# Patient Record
Sex: Female | Born: 1990 | Race: Black or African American | Hispanic: No | Marital: Single | Smoking: Current every day smoker
Health system: Southern US, Community
[De-identification: ages and names within clinical notes are randomized; demographics above are authoritative.]

## PROBLEM LIST (undated history)

## (undated) DIAGNOSIS — F1910 Other psychoactive substance abuse, uncomplicated: Secondary | ICD-10-CM

## (undated) HISTORY — DX: Other psychoactive substance abuse, uncomplicated: F19.10

## (undated) HISTORY — PX: NO PAST SURGERIES: SHX2092

---
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
## 2019-07-07 ENCOUNTER — Encounter (HOSPITAL_COMMUNITY): Payer: Self-pay | Admitting: Emergency Medicine

## 2019-07-07 ENCOUNTER — Other Ambulatory Visit: Payer: Self-pay

## 2019-07-07 ENCOUNTER — Emergency Department (HOSPITAL_COMMUNITY)
Admission: EM | Admit: 2019-07-07 | Discharge: 2019-07-07 | Discharge status: Against Medical Advice | Payer: Self-pay | Attending: Emergency Medicine | Admitting: Emergency Medicine

## 2019-07-07 DIAGNOSIS — Z5321 Procedure and treatment not carried out due to patient leaving prior to being seen by health care provider: Secondary | ICD-10-CM | POA: Insufficient documentation

## 2019-07-07 NOTE — ED Notes (Signed)
Patient called for vitals, no response.

## 2019-07-07 NOTE — ED Triage Notes (Signed)
Pt relapsed on alcohol. Drinks a couple of pints of liquor daily. Last drink 1am. Pt alert, oriented.

## 2019-07-07 NOTE — ED Notes (Signed)
Patient stated "she was leaving to go to another hospital"

## 2019-07-07 NOTE — ED Notes (Signed)
Pt pacing around waiting room, very anxious.

## 2020-11-27 ENCOUNTER — Emergency Department (HOSPITAL_COMMUNITY): Payer: Self-pay

## 2020-11-27 ENCOUNTER — Encounter (HOSPITAL_COMMUNITY): Payer: Self-pay | Admitting: *Deleted

## 2020-11-27 ENCOUNTER — Other Ambulatory Visit: Payer: Self-pay

## 2020-11-27 ENCOUNTER — Emergency Department (HOSPITAL_COMMUNITY)
Admission: EM | Admit: 2020-11-27 | Discharge: 2020-11-27 | Disposition: A | Discharge status: Home / Self Care | Payer: Self-pay | Attending: Emergency Medicine | Admitting: Emergency Medicine

## 2020-11-27 DIAGNOSIS — Z23 Encounter for immunization: Secondary | ICD-10-CM | POA: Insufficient documentation

## 2020-11-27 DIAGNOSIS — Z20822 Contact with and (suspected) exposure to covid-19: Secondary | ICD-10-CM | POA: Insufficient documentation

## 2020-11-27 DIAGNOSIS — R410 Disorientation, unspecified: Secondary | ICD-10-CM | POA: Insufficient documentation

## 2020-11-27 DIAGNOSIS — F17200 Nicotine dependence, unspecified, uncomplicated: Secondary | ICD-10-CM | POA: Insufficient documentation

## 2020-11-27 DIAGNOSIS — R000 Tachycardia, unspecified: Secondary | ICD-10-CM | POA: Insufficient documentation

## 2020-11-27 DIAGNOSIS — S0081XA Abrasion of other part of head, initial encounter: Secondary | ICD-10-CM

## 2020-11-27 DIAGNOSIS — S62636A Displaced fracture of distal phalanx of right little finger, initial encounter for closed fracture: Secondary | ICD-10-CM

## 2020-11-27 DIAGNOSIS — F10121 Alcohol abuse with intoxication delirium: Secondary | ICD-10-CM | POA: Insufficient documentation

## 2020-11-27 DIAGNOSIS — R079 Chest pain, unspecified: Secondary | ICD-10-CM

## 2020-11-27 DIAGNOSIS — F10921 Alcohol use, unspecified with intoxication delirium: Secondary | ICD-10-CM

## 2020-11-27 DIAGNOSIS — Y92410 Unspecified street and highway as the place of occurrence of the external cause: Secondary | ICD-10-CM | POA: Insufficient documentation

## 2020-11-27 DIAGNOSIS — S0181XA Laceration without foreign body of other part of head, initial encounter: Secondary | ICD-10-CM | POA: Insufficient documentation

## 2020-11-27 LAB — PROTIME-INR
INR: 1.1 (ref 0.8–1.2)
Prothrombin Time: 13.5 seconds (ref 11.4–15.2)

## 2020-11-27 LAB — I-STAT CHEM 8, ED
BUN: 10 mg/dL (ref 6–20)
Calcium, Ion: 0.9 mmol/L — ABNORMAL LOW (ref 1.15–1.40)
Chloride: 107 mmol/L (ref 98–111)
Creatinine, Ser: 1.5 mg/dL — ABNORMAL HIGH (ref 0.44–1.00)
Glucose, Bld: 98 mg/dL (ref 70–99)
HCT: 38 % (ref 36.0–46.0)
Hemoglobin: 12.9 g/dL (ref 12.0–15.0)
Potassium: 3.9 mmol/L (ref 3.5–5.1)
Sodium: 140 mmol/L (ref 135–145)
TCO2: 20 mmol/L — ABNORMAL LOW (ref 22–32)

## 2020-11-27 LAB — RESP PANEL BY RT-PCR (FLU A&B, COVID) ARPGX2
Influenza A by PCR: NEGATIVE
Influenza B by PCR: NEGATIVE
SARS Coronavirus 2 by RT PCR: NEGATIVE

## 2020-11-27 LAB — SAMPLE TO BLOOD BANK

## 2020-11-27 LAB — COMPREHENSIVE METABOLIC PANEL
ALT: 19 U/L (ref 0–44)
AST: 44 U/L — ABNORMAL HIGH (ref 15–41)
Albumin: 4.1 g/dL (ref 3.5–5.0)
Alkaline Phosphatase: 33 U/L — ABNORMAL LOW (ref 38–126)
Anion gap: 11 (ref 5–15)
BUN: 9 mg/dL (ref 6–20)
CO2: 21 mmol/L — ABNORMAL LOW (ref 22–32)
Calcium: 8.7 mg/dL — ABNORMAL LOW (ref 8.9–10.3)
Chloride: 105 mmol/L (ref 98–111)
Creatinine, Ser: 0.87 mg/dL (ref 0.44–1.00)
GFR, Estimated: >60 mL/min (ref >60)
Glucose, Bld: 101 mg/dL — ABNORMAL HIGH (ref 70–99)
Potassium: 3.7 mmol/L (ref 3.5–5.1)
Sodium: 137 mmol/L (ref 135–145)
Total Bilirubin: 0.8 mg/dL (ref 0.3–1.2)
Total Protein: 7.6 g/dL (ref 6.5–8.1)

## 2020-11-27 LAB — CBC
HCT: 36.1 % (ref 36.0–46.0)
Hemoglobin: 11.7 g/dL — ABNORMAL LOW (ref 12.0–15.0)
MCH: 30.2 pg (ref 26.0–34.0)
MCHC: 32.4 g/dL (ref 30.0–36.0)
MCV: 93.3 fL (ref 80.0–100.0)
Platelets: 393 10*3/uL (ref 150–400)
RBC: 3.87 MIL/uL (ref 3.87–5.11)
RDW: 13.7 % (ref 11.5–15.5)
WBC: 6.8 10*3/uL (ref 4.0–10.5)
nRBC: 0 % (ref 0.0–0.2)

## 2020-11-27 LAB — I-STAT BETA HCG BLOOD, ED (MC, WL, AP ONLY): I-stat hCG, quantitative: <5 m[IU]/mL (ref <5)

## 2020-11-27 LAB — LACTIC ACID, PLASMA: Lactic Acid, Venous: 2.5 mmol/L (ref 0.5–1.9)

## 2020-11-27 LAB — ETHANOL: Alcohol, Ethyl (B): 430 mg/dL (ref <10)

## 2020-11-27 MED ORDER — KETAMINE HCL 50 MG/5ML IJ SOSY: 150.0000 mg | PREFILLED_SYRINGE | Freq: Once | INTRAMUSCULAR | Status: AC

## 2020-11-27 MED ORDER — FENTANYL CITRATE (PF) 100 MCG/2ML IJ SOLN
50.0000 ug | Freq: Once | INTRAMUSCULAR | Status: AC
Start: 2020-11-27 — End: 2020-11-27
  Administered 2020-11-27: 50 ug via INTRAVENOUS
  Filled 2020-11-27: qty 2

## 2020-11-27 MED ORDER — TETANUS-DIPHTH-ACELL PERTUSSIS 5-2.5-18.5 LF-MCG/0.5 IM SUSY
0.5000 mL | PREFILLED_SYRINGE | Freq: Once | INTRAMUSCULAR | Status: AC
  Administered 2020-11-27: 0.5 mL via INTRAMUSCULAR
  Filled 2020-11-27: qty 0.5

## 2020-11-27 MED ORDER — ONDANSETRON HCL 4 MG/2ML IJ SOLN
4.0000 mg | Freq: Once | INTRAMUSCULAR | Status: AC
  Administered 2020-11-27: 4 mg via INTRAVENOUS
  Filled 2020-11-27: qty 2

## 2020-11-27 MED ORDER — ONDANSETRON HCL 4 MG/2ML IJ SOLN
4.0000 mg | Freq: Once | INTRAMUSCULAR | Status: AC
  Administered 2020-11-27: 4 mg via INTRAVENOUS

## 2020-11-27 MED ORDER — KETAMINE HCL 50 MG/5ML IJ SOSY
PREFILLED_SYRINGE | INTRAMUSCULAR | Status: AC
  Administered 2020-11-27: 150 mg via INTRAVENOUS
  Filled 2020-11-27: qty 20

## 2020-11-27 MED ORDER — PROPOFOL 1000 MG/100ML IV EMUL
INTRAVENOUS | Status: AC
  Administered 2020-11-27: 20 mg
  Filled 2020-11-27: qty 100

## 2020-11-27 MED ORDER — FENTANYL CITRATE (PF) 100 MCG/2ML IJ SOLN
INTRAMUSCULAR | Status: AC
  Filled 2020-11-27: qty 2

## 2020-11-27 MED ORDER — IOHEXOL 300 MG/ML  SOLN
100.0000 mL | Freq: Once | INTRAMUSCULAR | Status: AC | PRN
  Administered 2020-11-27: 100 mL via INTRAVENOUS

## 2020-11-27 MED ORDER — SODIUM CHLORIDE 0.9 % IV BOLUS
1000.0000 mL | Freq: Once | INTRAVENOUS | Status: AC
  Administered 2020-11-27: 1000 mL via INTRAVENOUS

## 2020-11-27 MED ORDER — PROPOFOL 1000 MG/100ML IV EMUL: 5.0000 ug/kg/min | INTRAVENOUS | Status: DC

## 2020-11-27 MED ORDER — KETAMINE HCL 50 MG/5ML IJ SOSY
0.3000 mg/kg | PREFILLED_SYRINGE | Freq: Once | INTRAMUSCULAR | Status: DC
  Filled 2020-11-27: qty 5

## 2020-11-27 NOTE — ED Notes (Signed)
To c-t after portable chest in the dept

## 2020-11-27 NOTE — ED Notes (Signed)
The pt called someone on the phone   Dressed in scrubs and walked out the pt would not wait

## 2020-11-27 NOTE — ED Notes (Signed)
Order to lower propofol to extubate her  Down to 20 mg

## 2020-11-27 NOTE — ED Notes (Signed)
Ketamine 50 mg iv per karen cobb rn
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
## 2020-11-27 NOTE — ED Notes (Signed)
Eric-cousin, [phone], please call if pt gets d/c'd
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
## 2020-11-27 NOTE — ED Notes (Signed)
Propofol increased pts eyes open and shes starting to move around  10mg  bolus also

## 2020-11-27 NOTE — ED Notes (Signed)
Roc 100mg  given iv by karen cobb rn

## 2020-11-27 NOTE — ED Notes (Signed)
Dr Adela Lank waiting to get an official readying from the radiologist before he extubates the pt
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
## 2020-11-27 NOTE — ED Notes (Signed)
Trauma  Doctor   Freida Busman at bedside  Dr Adela Lank at bedside preparinf to intubate to get her treated

## 2020-11-27 NOTE — ED Notes (Signed)
Cynthia Payne [phone])

## 2020-11-27 NOTE — ED Notes (Signed)
Pt getting ketamine iv per karen cobb rn    100mg  ketamine     edp preparing to intubate  Po 99 %

## 2020-11-27 NOTE — ED Notes (Signed)
Pt fighting has to be held down  bolused with 20 mg per dr Adela Lank
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
## 2020-11-27 NOTE — ED Notes (Signed)
Pt returned from ct

## 2020-11-27 NOTE — ED Notes (Signed)
prppofol turned off per order dr Merwyn Katos.  At 1825.  Extubated by resp therapt 1835  Pt fighting vomited bloody secretions from the tube being pulled
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
## 2020-11-27 NOTE — ED Notes (Signed)
Ketamine 24mg   Iv by rn in c-t  Back to room unable to scan the pt fighting

## 2020-11-27 NOTE — ED Notes (Signed)
Intubated by dr Adela Lank
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
## 2020-11-27 NOTE — Procedures (Signed)
Extubation Procedure Note  Patient Details:   Name: Cynthia Payne DOB: 19-Feb-1991 MRN: [id number]   Airway Documentation:    Vent end date: (not recorded) Vent end time: (not recorded)   Evaluation  O2 sats: stable throughout Complications: No apparent complications Patient did tolerate procedure well. Bilateral Breath Sounds: Clear  Patient able to speak.  Yes  [name] 11/27/2020, 6:34 PM

## 2021-04-01 ENCOUNTER — Encounter (HOSPITAL_BASED_OUTPATIENT_CLINIC_OR_DEPARTMENT_OTHER): Payer: Self-pay | Admitting: Emergency Medicine

## 2021-04-01 ENCOUNTER — Other Ambulatory Visit: Payer: Self-pay

## 2021-04-01 ENCOUNTER — Emergency Department (HOSPITAL_BASED_OUTPATIENT_CLINIC_OR_DEPARTMENT_OTHER)
Admission: EM | Admit: 2021-04-01 | Discharge: 2021-04-01 | Disposition: A | Discharge status: Home / Self Care | Payer: Self-pay | Attending: Emergency Medicine | Admitting: Emergency Medicine

## 2021-04-01 DIAGNOSIS — N390 Urinary tract infection, site not specified: Secondary | ICD-10-CM | POA: Insufficient documentation

## 2021-04-01 DIAGNOSIS — F17210 Nicotine dependence, cigarettes, uncomplicated: Secondary | ICD-10-CM | POA: Insufficient documentation

## 2021-04-01 LAB — URINALYSIS, MICROSCOPIC (REFLEX): WBC, UA: >50 WBC/hpf (ref 0–5)

## 2021-04-01 LAB — URINALYSIS, ROUTINE W REFLEX MICROSCOPIC
Bilirubin Urine: NEGATIVE
Glucose, UA: NEGATIVE mg/dL
Ketones, ur: NEGATIVE mg/dL
Nitrite: NEGATIVE
Protein, ur: NEGATIVE mg/dL
Specific Gravity, Urine: >1.03 — ABNORMAL HIGH (ref 1.005–1.030)
pH: 5.5 (ref 5.0–8.0)

## 2021-04-01 LAB — PREGNANCY, URINE: Preg Test, Ur: NEGATIVE

## 2021-04-01 MED ORDER — CEPHALEXIN 500 MG PO CAPS: 500.0000 mg | ORAL_CAPSULE | Freq: Two times a day (BID) | ORAL | 0 refills | Status: DC

## 2021-04-01 NOTE — ED Provider Notes (Signed)
[facility] EMERGENCY DEPARTMENT Provider Note   CSN: [id number] Arrival date & time: 04/01/21  7026     History Chief Complaint  Patient presents with   Urinary Frequency    Cynthia Payne is a 30 y.o. female.  Patient is a 30 year old female who presents from [facility] treatment [facility].  She reports a 2-day history of some pressure when she urinates.  She says when she has a sense of urination she gets a pressure in her bladder area.  She currently denies any pain in her abdomen.  No back pain.  She has some urinary frequency but no burning on urination.  No fevers.  No nausea or vomiting.  She started her menstrual cycle today.  She has had a history of prior UTIs in the past.  She denies any vaginal bleeding or discharge.  She also is concerned because she has not been able to sleep very well at [facility].  She has tried melatonin that has not helped.      History reviewed. No pertinent past medical history.  There are no problems to display for this patient.   History reviewed. No pertinent surgical history.   OB History   No obstetric history on file.     No family history on file.  Social History   Tobacco Use   Smoking status: Every Day    Pack years: 0.00    Types: Cigarettes   Smokeless tobacco: Never  Substance Use Topics   Alcohol use: Yes    Comment: couple pints of liquor daily   Drug use: Never    Home Medications Prior to Admission medications   Medication Sig Start Date End Date Taking? Authorizing Provider  cephALEXin (KEFLEX) 500 MG capsule Take 1 capsule (500 mg total) by mouth 2 (two) times daily. 04/01/21  Yes Rolan Bucco, MD    Allergies    Other  Review of Systems   Review of Systems  Constitutional:  Negative for chills, diaphoresis, fatigue and fever.  HENT:  Negative for congestion, rhinorrhea and sneezing.   Eyes: Negative.   Respiratory:  Negative for cough, chest tightness and shortness of breath.   Cardiovascular:   Negative for chest pain and leg swelling.  Gastrointestinal:  Negative for abdominal pain, blood in stool, diarrhea, nausea and vomiting.  Genitourinary:  Positive for frequency and vaginal bleeding. Negative for difficulty urinating, flank pain, hematuria, vaginal discharge and vaginal pain.  Musculoskeletal:  Negative for arthralgias and back pain.  Skin:  Negative for rash.  Neurological:  Negative for dizziness, speech difficulty, weakness, numbness and headaches.   Physical Exam Updated Vital Signs BP 115/84 (BP Location: Right Arm)   Pulse 77   Temp 98.1 F (36.7 C) (Oral)   Resp 17   Ht 5\' 8"  (1.727 m)   Wt 77.1 kg   SpO2 99%   BMI 25.85 kg/m   Physical Exam Constitutional:      Appearance: She is well-developed.  HENT:     Head: Normocephalic and atraumatic.  Eyes:     Pupils: Pupils are equal, round, and reactive to light.  Cardiovascular:     Rate and Rhythm: Normal rate and regular rhythm.     Heart sounds: Normal heart sounds.  Pulmonary:     Effort: Pulmonary effort is normal. No respiratory distress.     Breath sounds: Normal breath sounds. No wheezing or rales.  Chest:     Chest wall: No tenderness.  Abdominal:     General: Bowel  sounds are normal.     Palpations: Abdomen is soft.     Tenderness: There is no abdominal tenderness. There is no guarding or rebound.  Musculoskeletal:        General: Normal range of motion.     Cervical back: Normal range of motion and neck supple.  Lymphadenopathy:     Cervical: No cervical adenopathy.  Skin:    General: Skin is warm and dry.     Findings: No rash.  Neurological:     Mental Status: She is alert and oriented to person, place, and time.    ED Results / Procedures / Treatments   Labs (all labs ordered are listed, but only abnormal results are displayed) Labs Reviewed  URINALYSIS, ROUTINE W REFLEX MICROSCOPIC - Abnormal; Notable for the following components:      Result Value   APPearance CLOUDY (*)     Specific Gravity, Urine >1.030 (*)    Hgb urine dipstick LARGE (*)    Leukocytes,Ua LARGE (*)    All other components within normal limits  URINALYSIS, MICROSCOPIC (REFLEX) - Abnormal; Notable for the following components:   Bacteria, UA MANY (*)    All other components within normal limits  URINE CULTURE  PREGNANCY, URINE    EKG None  Radiology No results found.  Procedures Procedures   Medications Ordered in ED Medications - No data to display  ED Course  I have reviewed the triage vital signs and the nursing notes.  Pertinent labs & imaging results that were available during my care of the patient were reviewed by me and considered in my medical decision making (see chart for details).    MDM Rules/Calculators/A&P                          Patient presents with some suprapubic pressure on urination.  Her urine is consistent with infection.  She does not have other symptoms that would be more concerning for renal colic.  She does not appear systemically ill.  She is having no vaginal symptoms.  She was started on Keflex.  Her urine was sent for culture.  She was advised that she could use Benadryl at night for sleeping.  Return precautions were given. Final Clinical Impression(s) / ED Diagnoses Final diagnoses:  Lower urinary tract infectious disease    Rx / DC Orders ED Discharge Orders          Ordered    cephALEXin (KEFLEX) 500 MG capsule  2 times daily        04/01/21 0854             Rolan Bucco, MD 04/01/21 [phone]

## 2021-04-01 NOTE — ED Triage Notes (Signed)
Pt reports urinary frequency and left sided groin "pressure."

## 2021-04-03 LAB — URINE CULTURE: Culture: >=100000 — AB

## 2021-04-04 ENCOUNTER — Telehealth: Payer: Self-pay | Admitting: *Deleted

## 2021-04-04 NOTE — Telephone Encounter (Signed)
Post ED Visit - Positive Culture Follow-up  Culture report reviewed by antimicrobial stewardship pharmacist: [facility] []  Nathan Batchelder, Pharm.D. []  , Pharm.D., BCPS AQ-ID []  , Pharm.D., BCPS []  Celedonio Miyamoto, .D., BCPS []  [place], .D., BCPS, AAHIVP []  Georgina Pillion, Pharm.D., BCPS, AAHIVP []  [address], PharmD, BCPS []  , PharmD, BCPS []  [place], PharmD, BCPS []  [address], PharmD []  , PharmD, BCPS []  Estella Husk, PharmD  Pharmacy Team []  Lysle Pearl, PharmD []  , PharmD []  Phillips Climes, PharmD []  , Rph []  Agapito Games) , PharmD []  Verlan Friends, PharmD []  , PharmD []  Mervyn Gay, PharmD []  , PharmD []  Vinnie Level, PharmD []  [facility], PharmD []  , PharmD []  Len Childs, PharmD   Positive urine culture Treated with Cephalexin, organism sensitive to the same and no further patient follow-up is required at this time. Khushboo patel, PharmD [place] 04/04/2021, 12:03 PM

## 2021-06-07 ENCOUNTER — Ambulatory Visit: Payer: Self-pay

## 2021-06-21 ENCOUNTER — Ambulatory Visit: Payer: Self-pay | Admitting: Gerontology

## 2021-06-21 ENCOUNTER — Other Ambulatory Visit: Payer: Self-pay

## 2021-06-21 ENCOUNTER — Encounter: Payer: Self-pay | Admitting: Gerontology

## 2021-06-21 VITALS — BP 118/81 | HR 83 | Temp 98.1°F | Resp 16 | Ht 67.75 in | Wt 183.5 lb

## 2021-06-21 DIAGNOSIS — N946 Dysmenorrhea, unspecified: Secondary | ICD-10-CM

## 2021-06-21 DIAGNOSIS — K0889 Other specified disorders of teeth and supporting structures: Secondary | ICD-10-CM

## 2021-06-21 DIAGNOSIS — Z0000 Encounter for general adult medical examination without abnormal findings: Secondary | ICD-10-CM

## 2021-06-21 DIAGNOSIS — F1990 Other psychoactive substance use, unspecified, uncomplicated: Secondary | ICD-10-CM

## 2021-06-21 MED ORDER — VITAMIN B-12 1000 MCG PO TABS
1000.0000 ug | ORAL_TABLET | Freq: Every day | ORAL | 1 refills | Status: DC
  Filled 2021-06-21: qty 90, 90d supply, fill #0

## 2021-06-21 MED ORDER — VITAMIN B-1 100 MG PO TABS
100.0000 mg | ORAL_TABLET | Freq: Every day | ORAL | 1 refills | Status: DC
  Filled 2021-06-21: qty 30, 30d supply, fill #0

## 2021-06-21 MED ORDER — IBUPROFEN 800 MG PO TABS
800.0000 mg | ORAL_TABLET | Freq: Three times a day (TID) | ORAL | 1 refills | Status: DC | PRN
  Filled 2021-06-21: qty 60, 20d supply, fill #0

## 2021-06-21 MED ORDER — FOLIC ACID 1 MG PO TABS
1.0000 mg | ORAL_TABLET | Freq: Every day | ORAL | 1 refills | Status: DC
  Filled 2021-06-21: qty 30, 30d supply, fill #0

## 2021-06-21 NOTE — Progress Notes (Signed)
 Patient ID: Cynthia Payne, female   DOB: 13-Sep-1991, 30 y.o.   MRN: [id number]  Chief Complaint  Patient presents with   Establish Care   elevated lfts    Patient has a history of alcohol abuse. Patient has been 100 days sober (03/13/21). Patient is a resident of [facility].    HPI Cynthia Payne is a 30 y.o. female is a 30 y/o female with a h/o substance use disorder who presents for an initial office visit. She reports multiple health concerns today. Back pain: which she describes as chronic. Pain is mostly in the lower back.  Previous management with nsaids and tylenol has not been successful. Unable to identify aggravating or alleviating factors. No associated numbness and tingling Toothache: reports multiple teeth with caries. Reports tooth pain as severe. She has not seen a dentist in years. Denies bleeding Painful menses: chronic; and heavy periods. Has not taken any remedies ETOH abuse: reports current remission and has been adhering to the treatment plan ar [facility]. She is unsure about her vaccines. She has not had a recent pap smear.    Past Medical History:  Diagnosis Date   Substance abuse [facility])     Past Surgical History:  Procedure Laterality Date   NO PAST SURGERIES      Family History  Problem Relation Age of Onset   Healthy Mother    Congestive Heart Failure Father    Alcoholism Father    Drug abuse Brother        heroin overdose    Social History Social History   Tobacco Use   Smoking status: Every Day    Packs/day: 0.50    Years: 13.00    Pack years: 6.50    Types: Cigarettes   Smokeless tobacco: Never   Tobacco comments:    Patient is a resident at [facility] receiving treatment for alcoholism  Vaping Use   Vaping Use: Never used  Substance Use Topics   Alcohol use: Not Currently    Comment: couple pints of liquor daily, last use 03/13/21   Drug use: Never    Allergies  Allergen Reactions   Other Itching and Other (See Comments)    Seasonal allergies-  Runny nose, itchy eyes, sniffles    Current Outpatient Medications  Medication Sig Dispense Refill   triamcinolone  ointment (KENALOG ) 0.1 % Apply once or twice daily to affected areas for 1 to 2 weeks     No current facility-administered medications for this visit.    Review of Systems Review of Systems  Constitutional:  Negative for appetite change, chills, fatigue and fever.  Eyes: Negative.   Endocrine: Negative for polydipsia, polyphagia and polyuria.  Musculoskeletal:  Positive for back pain.  Skin: Negative.   Allergic/Immunologic: Negative for environmental allergies.  Neurological: Negative.   Hematological: Negative.   Psychiatric/Behavioral:  Negative for sleep disturbance and suicidal ideas.     Blood pressure 118/81, pulse 83, temperature 98.1 F (36.7 C), resp. rate 16, height 5' 7.75 (1.721 m), weight 183 lb 8 oz (83.2 kg), last menstrual period 06/18/2021, SpO2 98 %.  Physical Exam Physical Exam Vitals and nursing note reviewed.  Constitutional:      Appearance: Normal appearance.  HENT:     Nose: Nose normal.     Mouth/Throat:     Mouth: Mucous membranes are moist.     Comments: Poor dentition with caries in multiple teeth, mild gum inflammation Eyes:     Extraocular Movements: Extraocular movements intact.     Conjunctiva/sclera:  Conjunctivae normal.     Pupils: Pupils are equal, round, and reactive to light.  Cardiovascular:     Rate and Rhythm: Normal rate and regular rhythm.     Pulses: Normal pulses.     Heart sounds: Normal heart sounds.  Pulmonary:     Effort: Pulmonary effort is normal.     Breath sounds: Normal breath sounds.  Abdominal:     General: Abdomen is flat. Bowel sounds are normal.     Palpations: Abdomen is soft.  Musculoskeletal:        General: No tenderness. Normal range of motion.     Cervical back: Normal range of motion and neck supple.  Skin:    General: Skin is warm and dry.     Capillary Refill: Capillary refill  takes less than 2 seconds.  Neurological:     General: No focal deficit present.     Mental Status: She is alert and oriented to person, place, and time.  Psychiatric:        Mood and Affect: Mood normal.        Thought Content: Thought content normal.     Assessment and plan 1. Dysmenorrhea (Primary) Referral to OB Advised to take OTC iron supplements  2. Substance use disorder Continue treatment at [facility]  3. Toothache Will give augmenting while awaiting denistal eval Referral to dentist provided with coupon  4. Health maintenance examination Will order routine labs. Health maintenance education provided - Ambulatory referral to Psychiatry - Cytology - PAP( [place]) - Ambulatory referral to Dentistry - CBC w/Diff - Comp Met ([facility]) - TSH - HgB A1c - Lipid Profile - Magnesium - Phosphorus      Cynthia Payne 06/21/2021, 1:22 PM

## 2021-06-21 NOTE — Patient Instructions (Addendum)

## 2021-06-22 LAB — LIPID PANEL
Chol/HDL Ratio: 2.8 ratio (ref 0.0–4.4)
Cholesterol, Total: 184 mg/dL (ref 100–199)
HDL: 65 mg/dL (ref >39)
LDL Chol Calc (NIH): 108 mg/dL — ABNORMAL HIGH (ref 0–99)
Triglycerides: 57 mg/dL (ref 0–149)
VLDL Cholesterol Cal: 11 mg/dL (ref 5–40)

## 2021-06-22 LAB — HEMOGLOBIN A1C
Est. average glucose Bld gHb Est-mCnc: 103 mg/dL
Hgb A1c MFr Bld: 5.2 % (ref 4.8–5.6)

## 2021-06-22 LAB — TSH: TSH: 0.784 u[IU]/mL (ref 0.450–4.500)

## 2021-06-22 LAB — COMPREHENSIVE METABOLIC PANEL
ALT: 8 IU/L (ref 0–32)
AST: 16 IU/L (ref 0–40)
Albumin/Globulin Ratio: 1.7 (ref 1.2–2.2)
Albumin: 4.3 g/dL (ref 3.8–4.8)
Alkaline Phosphatase: 46 IU/L (ref 44–121)
BUN/Creatinine Ratio: 13 (ref 9–23)
BUN: 10 mg/dL (ref 6–20)
Bilirubin Total: <0.2 mg/dL (ref 0.0–1.2)
CO2: 20 mmol/L (ref 20–29)
Calcium: 9.2 mg/dL (ref 8.7–10.2)
Chloride: 105 mmol/L (ref 96–106)
Creatinine, Ser: 0.78 mg/dL (ref 0.57–1.00)
Globulin, Total: 2.6 g/dL (ref 1.5–4.5)
Glucose: 89 mg/dL (ref 70–99)
Potassium: 3.9 mmol/L (ref 3.5–5.2)
Sodium: 139 mmol/L (ref 134–144)
Total Protein: 6.9 g/dL (ref 6.0–8.5)
eGFR: 104 mL/min/{1.73_m2} (ref >59)

## 2021-06-22 LAB — CBC WITH DIFFERENTIAL/PLATELET
Basophils Absolute: 0.1 10*3/uL (ref 0.0–0.2)
Basos: 2 %
EOS (ABSOLUTE): 0.1 10*3/uL (ref 0.0–0.4)
Eos: 2 %
Hematocrit: 31.2 % — ABNORMAL LOW (ref 34.0–46.6)
Hemoglobin: 10.5 g/dL — ABNORMAL LOW (ref 11.1–15.9)
Immature Grans (Abs): 0 10*3/uL (ref 0.0–0.1)
Immature Granulocytes: 0 %
Lymphocytes Absolute: 1.4 10*3/uL (ref 0.7–3.1)
Lymphs: 34 %
MCH: 29.9 pg (ref 26.6–33.0)
MCHC: 33.7 g/dL (ref 31.5–35.7)
MCV: 89 fL (ref 79–97)
Monocytes Absolute: 0.5 10*3/uL (ref 0.1–0.9)
Monocytes: 13 %
Neutrophils Absolute: 2 10*3/uL (ref 1.4–7.0)
Neutrophils: 49 %
Platelets: 365 10*3/uL (ref 150–450)
RBC: 3.51 x10E6/uL — ABNORMAL LOW (ref 3.77–5.28)
RDW: 12.2 % (ref 11.7–15.4)
WBC: 4.1 10*3/uL (ref 3.4–10.8)

## 2021-06-22 LAB — MAGNESIUM: Magnesium: 1.8 mg/dL (ref 1.6–2.3)

## 2021-06-22 LAB — PHOSPHORUS: Phosphorus: 3.7 mg/dL (ref 3.0–4.3)

## 2021-06-26 ENCOUNTER — Other Ambulatory Visit: Payer: Self-pay

## 2021-06-26 ENCOUNTER — Ambulatory Visit: Payer: Self-pay | Admitting: Pharmacy Technician

## 2021-06-26 DIAGNOSIS — Z79899 Other long term (current) drug therapy: Secondary | ICD-10-CM

## 2021-06-29 ENCOUNTER — Other Ambulatory Visit: Payer: Self-pay

## 2021-06-29 NOTE — Progress Notes (Signed)
Provided patient with contact information for [facility].  Completed [facility] application and contract.  Patient agreed to all terms of the [facility] contract.  Sending to patient to sign and return to [facility].  Patient approved to receive medication assistance at [facility] until time for re-certification in 5501, and as long as eligibility criteria continues to be met.    Provided patient with community resource material based on her particular needs.    [facility]

## 2021-07-06 ENCOUNTER — Other Ambulatory Visit: Payer: Self-pay

## 2021-07-19 ENCOUNTER — Other Ambulatory Visit: Payer: Self-pay

## 2021-07-19 ENCOUNTER — Encounter: Payer: Self-pay | Admitting: Gerontology

## 2021-07-19 ENCOUNTER — Ambulatory Visit: Payer: Self-pay | Admitting: Gerontology

## 2021-07-19 VITALS — BP 114/77 | HR 62 | Temp 98.1°F | Resp 16 | Ht 67.0 in | Wt 188.6 lb

## 2021-07-19 DIAGNOSIS — Z0000 Encounter for general adult medical examination without abnormal findings: Secondary | ICD-10-CM

## 2021-07-19 DIAGNOSIS — L853 Xerosis cutis: Secondary | ICD-10-CM | POA: Insufficient documentation

## 2021-07-19 DIAGNOSIS — K0889 Other specified disorders of teeth and supporting structures: Secondary | ICD-10-CM | POA: Insufficient documentation

## 2021-07-19 MED ORDER — TRIAMCINOLONE ACETONIDE 0.1 % EX OINT
TOPICAL_OINTMENT | CUTANEOUS | 0 refills | Status: DC
  Filled 2021-07-19 – 2021-07-20 (×2): qty 30, 14d supply, fill #0

## 2021-07-19 NOTE — Progress Notes (Signed)
Established Patient Office Visit  Subjective:  Patient ID: Cynthia Payne, female    DOB: Oct 18, 1990  Age: 30 y.o. MRN: [id number]  CC:  Chief Complaint  Patient presents with   Follow-up    Labs drawn 06/21/21   Medication Refill    Patient requesting refill on Triamcinolone cream    HPI Cynthia Payne  is a 29 y/o female who has history of substance abuse,presents for lab review. Her lab done on 06/21/21, LDL was 108 mg/dl, Hgb 10.5 and Hct was 31.2%. She states that she experiences dry pruritic area to her face and using Triamcinolone cream improves symptoms. She also c/o pain to her wisdom teeth, denies swelling,and erythema. She states that her mood is good, denies suicidal nor homicidal ideation. Overall, she states that she's doing well and offers no further complaint.  Past Medical History:  Diagnosis Date   Substance abuse [facility])     Past Surgical History:  Procedure Laterality Date   NO PAST SURGERIES      Family History  Problem Relation Age of Onset   Healthy Mother    Congestive Heart Failure Father    Alcoholism Father    Drug abuse Brother        heroin overdose    Social History   Socioeconomic History   Marital status: Single    Spouse name: Not on file   Number of children: Not on file   Years of education: Not on file   Highest education level: Not on file  Occupational History   Not on file  Tobacco Use   Smoking status: Every Day    Packs/day: 0.50    Years: 13.00    Pack years: 6.50    Types: Cigarettes   Smokeless tobacco: Never   Tobacco comments:    Patient is a resident at [facility] receiving treatment for alcoholism  Vaping Use   Vaping Use: Never used  Substance and Sexual Activity   Alcohol use: Not Currently    Comment: couple pints of liquor daily, last use 03/13/21   Drug use: Never   Sexual activity: Not Currently    Birth control/protection: None  Other Topics Concern   Not on file  Social History Narrative   ** Merged  History Encounter **       Social Determinants of Health   Financial Resource Strain: Not on file  Food Insecurity: No Food Insecurity   Worried About Charity fundraiser in the Last Year: Never true   [place] in the Last Year: Never true  Transportation Needs: No Transportation Needs   Lack of Transportation (Medical): No   Lack of Transportation (Non-Medical): No  Physical Activity: Not on file  Stress: Not on file  Social Connections: Not on file  Intimate Partner Violence: Not on file    Outpatient Medications Prior to Visit  Medication Sig Dispense Refill   folic acid (FOLVITE) 1 MG tablet Take 1 tablet (1 mg total) by mouth once daily. 90 tablet 1   ibuprofen (ADVIL) 800 MG tablet Take 1 tablet (800 mg total) by mouth every 8 (eight) hours as needed. 60 tablet 1   thiamine (VITAMIN B-1) 100 MG tablet Take 1 tablet (100 mg total) by mouth once daily. 90 tablet 1   vitamin B-12 (CYANOCOBALAMIN) 1000 MCG tablet Take 1 tablet (1,000 mcg total) by mouth once daily. 90 tablet 1   triamcinolone ointment (KENALOG) 0.1 % Apply once or twice daily to affected areas  for 1 to 2 weeks (Patient not taking: Reported on 07/19/2021)     No facility-administered medications prior to visit.    Allergies  Allergen Reactions   No Known Allergies    Other Itching and Other (See Comments)    Seasonal allergies- Runny nose, itchy eyes, sniffles    ROS Review of Systems  Constitutional: Negative.   Eyes: Negative.   Respiratory: Negative.    Cardiovascular: Negative.   Neurological: Negative.   Psychiatric/Behavioral: Negative.       Objective:    Physical Exam HENT:     Head: Normocephalic and atraumatic.  Cardiovascular:     Rate and Rhythm: Normal rate and regular rhythm.     Pulses: Normal pulses.     Heart sounds: Normal heart sounds.  Pulmonary:     Effort: Pulmonary effort is normal.     Breath sounds: Normal breath sounds.  Skin:    General: Skin is warm.      Findings: No lesion.  Neurological:     General: No focal deficit present.     Mental Status: She is alert and oriented to person, place, and time. Mental status is at baseline.  Psychiatric:        Mood and Affect: Mood normal.        Behavior: Behavior normal.        Thought Content: Thought content normal.        Judgment: Judgment normal.    BP 114/77 (BP Location: Left Arm, Patient Position: Sitting, Cuff Size: Large)   Pulse 62   Temp 98.1 F (36.7 C) (Oral)   Resp 16   Ht '5\' 7"'  (1.702 m)   Wt 188 lb 9.6 oz (85.5 kg)   LMP 07/11/2021 (Exact Date)   SpO2 98%   BMI 29.54 kg/m  Wt Readings from Last 3 Encounters:  07/19/21 188 lb 9.6 oz (85.5 kg)  06/21/21 183 lb 8 oz (83.2 kg)  04/01/21 170 lb (77.1 kg)     Health Maintenance Due  Topic Date Due   COVID-19 Vaccine (1) Never done   Pneumococcal Vaccine 79-38 Years old (1 - PCV) Never done   HIV Screening  Never done   Hepatitis C Screening  Never done   PAP SMEAR-Modifier  Never done    There are no preventive care reminders to display for this patient.  Lab Results  Component Value Date   TSH 0.784 06/21/2021   Lab Results  Component Value Date   WBC 4.1 06/21/2021   HGB 10.5 (L) 06/21/2021   HCT 31.2 (L) 06/21/2021   MCV 89 06/21/2021   PLT 365 06/21/2021   Lab Results  Component Value Date   NA 139 06/21/2021   K 3.9 06/21/2021   CO2 20 06/21/2021   GLUCOSE 89 06/21/2021   BUN 10 06/21/2021   CREATININE 0.78 06/21/2021   BILITOT <0.2 06/21/2021   ALKPHOS 46 06/21/2021   AST 16 06/21/2021   ALT 8 06/21/2021   PROT 6.9 06/21/2021   ALBUMIN 4.3 06/21/2021   CALCIUM 9.2 06/21/2021   ANIONGAP 11 11/27/2020   EGFR 104 06/21/2021   Lab Results  Component Value Date   CHOL 184 06/21/2021   Lab Results  Component Value Date   HDL 65 06/21/2021   Lab Results  Component Value Date   LDLCALC 108 (H) 06/21/2021   Lab Results  Component Value Date   TRIG 57 06/21/2021   Lab Results   Component Value Date  CHOLHDL 2.8 06/21/2021   Lab Results  Component Value Date   HGBA1C 5.2 06/21/2021      Assessment & Plan:    1. Dry skin dermatitis -She will apply cream, educated on medication side effects and advised to notify clinic. - triamcinolone ointment (KENALOG) 0.1 %; Apply once or twice daily to affected areas for 1 to 2 weeks  Dispense: 30 g; Refill: 0  2. Health maintenance examination - She was provided with Health Department information to schedule appointment for Pap smear.  3. Tooth ache -She was provided with Dental application and referral to Dentist. She was advised to notify clinic for worsening symptoms.     Follow-up: Return in about 6 months (around 01/17/2022), or if symptoms worsen or fail to improve.    Geniva Lohnes Jerold Coombe, NP

## 2021-07-20 ENCOUNTER — Other Ambulatory Visit: Payer: Self-pay

## 2021-07-24 ENCOUNTER — Other Ambulatory Visit: Payer: Self-pay
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
## 2021-10-18 ENCOUNTER — Ambulatory Visit: Payer: Self-pay | Admitting: Gerontology

## 2021-10-26 ENCOUNTER — Ambulatory Visit: Payer: Self-pay | Admitting: Gerontology

## 2021-10-26 ENCOUNTER — Other Ambulatory Visit: Payer: Self-pay

## 2021-10-26 VITALS — BP 119/79 | HR 65 | Temp 98.9°F | Resp 16 | Wt 196.9 lb

## 2021-10-26 DIAGNOSIS — M25571 Pain in right ankle and joints of right foot: Secondary | ICD-10-CM | POA: Insufficient documentation

## 2021-10-26 DIAGNOSIS — L853 Xerosis cutis: Secondary | ICD-10-CM

## 2021-10-26 NOTE — Progress Notes (Signed)
Established Patient Office Visit  Subjective:  Patient ID: Cynthia Payne, female    DOB: 01/12/1991  Age: 31 y.o. MRN: [id number]  CC: No chief complaint on file.   HPI Cynthia Payne  is a 31 y/o female who has history of substance use disorder,presents for follow up appointment. She reports experiencing intermittent pruritic  scattered rash that leaves dark spots to her face when it resolves. She states that applying triamcinolone cream offers relief when rash erupts, but requests Dermatology referral. She also c/o intermittent  non traumatic sharp 8/10 pain to her right ankle that started  3 days ago. Currently, she states that pain has resolved. She resides at [facility] for rehabilitation. Overall, she states that she's doing well and offers no further complaint.  Past Medical History:  Diagnosis Date   Substance abuse [facility])     Past Surgical History:  Procedure Laterality Date   NO PAST SURGERIES      Family History  Problem Relation Age of Onset   Healthy Mother    Congestive Heart Failure Father    Alcoholism Father    Drug abuse Brother        heroin overdose    Social History   Socioeconomic History   Marital status: Single    Spouse name: Not on file   Number of children: Not on file   Years of education: Not on file   Highest education level: Not on file  Occupational History   Not on file  Tobacco Use   Smoking status: Every Day    Packs/day: 0.50    Years: 13.00    Pack years: 6.50    Types: Cigarettes   Smokeless tobacco: Never   Tobacco comments:    Patient is a resident at [facility] receiving treatment for alcoholism  Vaping Use   Vaping Use: Never used  Substance and Sexual Activity   Alcohol use: Not Currently    Comment: couple pints of liquor daily, last use 03/13/21   Drug use: Never   Sexual activity: Not Currently    Birth control/protection: None  Other Topics Concern   Not on file  Social History Narrative   ** Merged History Encounter **        Social Determinants of Health   Financial Resource Strain: Not on file  Food Insecurity: No Food Insecurity   Worried About Charity fundraiser in the Last Year: Never true   [place] in the Last Year: Never true  Transportation Needs: No Transportation Needs   Lack of Transportation (Medical): No   Lack of Transportation (Non-Medical): No  Physical Activity: Not on file  Stress: Not on file  Social Connections: Not on file  Intimate Partner Violence: Not on file    Outpatient Medications Prior to Visit  Medication Sig Dispense Refill   folic acid (FOLVITE) 1 MG tablet Take 1 tablet (1 mg total) by mouth once daily. 90 tablet 1   ibuprofen (ADVIL) 800 MG tablet Take 1 tablet (800 mg total) by mouth every 8 (eight) hours as needed. 60 tablet 1   thiamine (VITAMIN B-1) 100 MG tablet Take 1 tablet (100 mg total) by mouth once daily. 90 tablet 1   triamcinolone ointment (KENALOG) 0.1 % Apply topically once or twice daily to affected area(s) for 1 to 2 weeks. 30 g 0   vitamin B-12 (CYANOCOBALAMIN) 1000 MCG tablet Take 1 tablet (1,000 mcg total) by mouth once daily. 90 tablet 1   No  facility-administered medications prior to visit.    Allergies  Allergen Reactions   No Known Allergies    Other Itching and Other (See Comments)    Seasonal allergies- Runny nose, itchy eyes, sniffles    ROS Review of Systems  Constitutional: Negative.   Respiratory: Negative.    Cardiovascular: Negative.   Musculoskeletal:  Positive for arthralgias (right ankle pain).  Skin:        Dark spots to face  Neurological: Negative.   Psychiatric/Behavioral: Negative.       Objective:    Physical Exam HENT:     Head: Normocephalic and atraumatic.  Cardiovascular:     Rate and Rhythm: Normal rate and regular rhythm.     Pulses: Normal pulses.     Heart sounds: Normal heart sounds.  Pulmonary:     Effort: Pulmonary effort is normal.     Breath sounds: Normal breath sounds.   Musculoskeletal:        General: Normal range of motion.  Skin:    Comments: Scattered dark discoloration to face  Neurological:     General: No focal deficit present.     Mental Status: She is alert and oriented to person, place, and time. Mental status is at baseline.  Psychiatric:        Mood and Affect: Mood normal.        Behavior: Behavior normal.        Thought Content: Thought content normal.        Judgment: Judgment normal.    BP 119/79 (BP Location: Left Arm, Patient Position: Sitting, Cuff Size: Large)    Pulse 65    Temp 98.9 F (37.2 C)    Resp 16    Wt 196 lb 14.4 oz (89.3 kg)    SpO2 98%    BMI 30.84 kg/m  Wt Readings from Last 3 Encounters:  10/26/21 196 lb 14.4 oz (89.3 kg)  07/19/21 188 lb 9.6 oz (85.5 kg)  06/21/21 183 lb 8 oz (83.2 kg)   Encouraged weight loss  Health Maintenance Due  Topic Date Due   COVID-19 Vaccine (1) Never done   HIV Screening  Never done   Hepatitis C Screening  Never done   PAP SMEAR-Modifier  Never done    There are no preventive care reminders to display for this patient.  Lab Results  Component Value Date   TSH 0.784 06/21/2021   Lab Results  Component Value Date   WBC 4.1 06/21/2021   HGB 10.5 (L) 06/21/2021   HCT 31.2 (L) 06/21/2021   MCV 89 06/21/2021   PLT 365 06/21/2021   Lab Results  Component Value Date   NA 139 06/21/2021   K 3.9 06/21/2021   CO2 20 06/21/2021   GLUCOSE 89 06/21/2021   BUN 10 06/21/2021   CREATININE 0.78 06/21/2021   BILITOT <0.2 06/21/2021   ALKPHOS 46 06/21/2021   AST 16 06/21/2021   ALT 8 06/21/2021   PROT 6.9 06/21/2021   ALBUMIN 4.3 06/21/2021   CALCIUM 9.2 06/21/2021   ANIONGAP 11 11/27/2020   EGFR 104 06/21/2021   Lab Results  Component Value Date   CHOL 184 06/21/2021   Lab Results  Component Value Date   HDL 65 06/21/2021   Lab Results  Component Value Date   LDLCALC 108 (H) 06/21/2021   Lab Results  Component Value Date   TRIG 57 06/21/2021   Lab Results   Component Value Date   CHOLHDL 2.8 06/21/2021  Lab Results  Component Value Date   HGBA1C 5.2 06/21/2021      Assessment & Plan:      1. Right ankle pain, unspecified chronicity - Pain has resolved, advised to wear compression stockings. She was advised to notify clinic for recurrent pain.  2. Dry skin dermatitis - She was advised to complete [facility] financial application for  - Ambulatory referral to Dermatology and use mild facial soap, avoid chemical.   Follow-up: Return in about 3 months (around 01/17/2022), or if symptoms worsen or fail to improve.    Buna Cuppett Jerold Coombe, NP

## 2021-12-14 ENCOUNTER — Other Ambulatory Visit: Payer: Self-pay | Admitting: Emergency Medicine

## 2021-12-14 ENCOUNTER — Other Ambulatory Visit: Payer: Self-pay

## 2021-12-14 DIAGNOSIS — L853 Xerosis cutis: Secondary | ICD-10-CM

## 2021-12-14 MED ORDER — TRIAMCINOLONE ACETONIDE 0.1 % EX OINT
TOPICAL_OINTMENT | CUTANEOUS | 0 refills | Status: DC
Start: 2021-12-14 — End: 2022-05-14
  Filled 2021-12-14: qty 30, 14d supply, fill #0

## 2021-12-15 ENCOUNTER — Other Ambulatory Visit: Payer: Self-pay

## 2022-01-17 ENCOUNTER — Ambulatory Visit: Payer: Self-pay | Admitting: Gerontology

## 2022-01-26 ENCOUNTER — Other Ambulatory Visit: Payer: Self-pay

## 2022-03-29 ENCOUNTER — Other Ambulatory Visit: Payer: Self-pay

## 2022-04-04 ENCOUNTER — Ambulatory Visit: Payer: Self-pay | Admitting: Gerontology

## 2022-04-18 ENCOUNTER — Ambulatory Visit: Payer: Self-pay | Admitting: Advanced Practice Midwife

## 2022-04-18 ENCOUNTER — Ambulatory Visit: Payer: Self-pay

## 2022-04-18 ENCOUNTER — Encounter: Payer: Self-pay | Admitting: Advanced Practice Midwife

## 2022-04-18 DIAGNOSIS — F17200 Nicotine dependence, unspecified, uncomplicated: Secondary | ICD-10-CM

## 2022-04-18 DIAGNOSIS — F1010 Alcohol abuse, uncomplicated: Secondary | ICD-10-CM

## 2022-04-18 DIAGNOSIS — Z113 Encounter for screening for infections with a predominantly sexual mode of transmission: Secondary | ICD-10-CM

## 2022-04-18 LAB — WET PREP FOR TRICH, YEAST, CLUE: Trichomonas Exam: NEGATIVE

## 2022-04-18 LAB — HM HEPATITIS C SCREENING LAB: HM Hepatitis Screen: NEGATIVE

## 2022-04-18 LAB — HM HIV SCREENING LAB: HM HIV Screening: NEGATIVE

## 2022-04-18 NOTE — Progress Notes (Signed)
Wet Prep results reviewed by provider prior to discharge. No treatments ordered/recommended. Patient aware clinic will call with abnormal lab results that were sent to outside Lab. Delynn Flavin RN

## 2022-04-18 NOTE — Progress Notes (Signed)
[facility] Department  STI clinic/screening visit [place] [address] [phone]  Subjective:  Cynthia Payne is a 31 y.o. SBF smoker G2P1 female being seen today for an STI screening visit. The patient reports they do not have symptoms.  Patient reports that they do not desire a pregnancy in the next year.   They reported they are not interested in discussing contraception today.    Patient's last menstrual period was 03/05/2022 (approximate).   Patient has the following medical conditions:   Patient Active Problem List   Diagnosis Date Noted   Morbid obesity ([place]) 196 lbs 04/18/2022   Dry skin dermatitis 07/19/2021    Chief Complaint  Patient presents with   SEXUALLY TRANSMITTED DISEASE    Screening     HPI  Patient reports asymptomatic. LMP 04/04/22. Last sex 03/21/22 without condom; with current partner x 1 mo; 1 partner in last 3 mo. Last MJ age 28. Last cigar age 20. Smoking 4-7 cpd. Last ETOH 03/13/22 (1/5 liquor + champagne) then went into rehab.   Last HIV test per patient/review of record was unknown Patient reports last pap was >10 years ago  Screening for MPX risk: Does the patient have an unexplained rash? No Is the patient MSM? No Does the patient endorse multiple sex partners or anonymous sex partners? No Did the patient have close or sexual contact with a person diagnosed with MPX? No Has the patient traveled outside the [place] where MPX is endemic? No Is there a high clinical suspicion for MPX-- evidenced by one of the following No  -Unlikely to be chickenpox  -Lymphadenopathy  -Rash that present in same phase of evolution on any given body part See flowsheet for further details and programmatic requirements.   Immunization history:  Immunization History  Administered Date(s) Administered   Hepatitis B 06/11/2005   MMR 03/23/1991, 05/03/1994   Meningococcal Conjugate 06/11/2005   Tdap 11/27/2020     The  following portions of the patient's history were reviewed and updated as appropriate: allergies, current medications, past medical history, past social history, past surgical history and problem list.  Objective:  There were no vitals filed for this visit.  Physical Exam Vitals and nursing note reviewed.  Constitutional:      Appearance: Normal appearance. She is obese.  HENT:     Head: Normocephalic and atraumatic.     Mouth/Throat:     Mouth: Mucous membranes are moist.     Pharynx: Oropharynx is clear. No oropharyngeal exudate or posterior oropharyngeal erythema.  Eyes:     Conjunctiva/sclera: Conjunctivae normal.  Pulmonary:     Effort: Pulmonary effort is normal.  Abdominal:     Palpations: Abdomen is soft. There is no mass.     Tenderness: There is no abdominal tenderness. There is no rebound.     Comments: Soft without masses or tenderness, fair tone  Genitourinary:    General: Normal vulva.     Exam position: Lithotomy position.     Pubic Area: No rash or pubic lice.      Labia:        Right: No rash or lesion.        Left: No rash or lesion.      Vagina: Vaginal discharge (white creamy leukorrhea, ph equivocal) present. No erythema, bleeding or lesions.     Cervix: Normal.     Uterus: Normal.      Adnexa: Right adnexa normal and left adnexa normal.  Rectum: Normal.     Comments: pH = equivocal Lymphadenopathy:     Head:     Right side of head: No preauricular or posterior auricular adenopathy.     Left side of head: No preauricular or posterior auricular adenopathy.     Cervical: No cervical adenopathy.     Right cervical: No superficial, deep or posterior cervical adenopathy.    Left cervical: No superficial, deep or posterior cervical adenopathy.     Upper Body:     Right upper body: No supraclavicular, axillary or epitrochlear adenopathy.     Left upper body: No supraclavicular, axillary or epitrochlear adenopathy.     Lower Body: No right inguinal  adenopathy. No left inguinal adenopathy.  Skin:    General: Skin is warm and dry.     Findings: No rash.  Neurological:     Mental Status: She is alert and oriented to person, place, and time.     Assessment and Plan:  Cynthia Payne is a 31 y.o. female presenting to the [facility] for STI screening  1. Screening examination for venereal disease Treat wet mount per standing orders Immunization nurse consult  - WET PREP FOR [place], YEAST, CLUE - Chlamydia/Gonorrhea [place] [facility] - Syphilis Serology, [facility] - HIV/HCV [place] [facility] - Gonococcus culture  2. Morbid obesity ([facility]) 196 lbs      No follow-ups on file.  No future appointments.  Herbie Saxon, CNM

## 2022-04-19 ENCOUNTER — Ambulatory Visit: Payer: Self-pay | Admitting: Dermatology

## 2022-04-22 LAB — GONOCOCCUS CULTURE

## 2022-05-14 ENCOUNTER — Other Ambulatory Visit: Payer: Self-pay | Admitting: Gerontology

## 2022-05-14 ENCOUNTER — Other Ambulatory Visit: Payer: Self-pay

## 2022-05-14 DIAGNOSIS — L853 Xerosis cutis: Secondary | ICD-10-CM

## 2022-05-15 ENCOUNTER — Other Ambulatory Visit: Payer: Self-pay

## 2022-05-15 MED ORDER — TRIAMCINOLONE ACETONIDE 0.1 % EX OINT
TOPICAL_OINTMENT | CUTANEOUS | 0 refills | Status: DC
  Filled 2022-05-15: qty 30, 14d supply, fill #0

## 2022-08-20 ENCOUNTER — Other Ambulatory Visit: Payer: Self-pay | Admitting: Gerontology

## 2022-08-20 ENCOUNTER — Other Ambulatory Visit: Payer: Self-pay

## 2022-08-20 DIAGNOSIS — L853 Xerosis cutis: Secondary | ICD-10-CM

## 2022-08-21 ENCOUNTER — Other Ambulatory Visit: Payer: Self-pay

## 2022-08-23 ENCOUNTER — Other Ambulatory Visit: Payer: Self-pay

## 2022-08-28 ENCOUNTER — Other Ambulatory Visit: Payer: Self-pay

## 2022-08-28 ENCOUNTER — Ambulatory Visit: Payer: Self-pay | Admitting: Gerontology

## 2022-08-28 ENCOUNTER — Encounter: Payer: Self-pay | Admitting: Gerontology

## 2022-08-28 VITALS — BP 119/83 | HR 81 | Temp 97.5°F | Resp 16 | Ht 68.0 in | Wt 205.9 lb

## 2022-08-28 DIAGNOSIS — Z0000 Encounter for general adult medical examination without abnormal findings: Secondary | ICD-10-CM

## 2022-08-28 DIAGNOSIS — L853 Xerosis cutis: Secondary | ICD-10-CM

## 2022-08-28 MED ORDER — TRIAMCINOLONE ACETONIDE 0.1 % EX OINT
TOPICAL_OINTMENT | CUTANEOUS | 1 refills | Status: DC
  Filled 2022-08-28: qty 30, 30d supply, fill #0
  Filled 2023-03-22: qty 30, 30d supply, fill #1

## 2022-08-28 NOTE — Progress Notes (Unsigned)
Established Patient Office Visit  Subjective   Patient ID: Cynthia Payne, female    DOB: May 18, 1991  Age: 31 y.o. MRN: [id number]  Chief Complaint  Patient presents with   Medication Refill    Triamcinolone cream    HPI  Cynthia Payne  is a 31 y/o female who has history of substance use disorder,presents for follow up visit. She continues to experience intermittent pruritic  scattered rash that leaves dark spots to her face when it resolves. She states that applying triamcinolone cream offers relief when rash erupts, but missed her Dermatology appointment. Overall, she states that she's doing well and offers no further complaint.  Review of Systems  Constitutional: Negative.   Respiratory: Negative.    Cardiovascular: Negative.   Skin:  Positive for rash (scattered non pruritic dry rash to face around mouth and jaw).  Neurological: Negative.   Psychiatric/Behavioral: Negative.        Objective:     BP 119/83 (BP Location: Left Arm, Patient Position: Sitting, Cuff Size: Large)   Pulse 81   Temp (!) 97.5 F (36.4 C)   Resp 16   Ht _0  (1.727 m)   Wt 205 lb 14.4 oz (93.4 kg)   LMP 08/13/2022 (Approximate)   SpO2 93%   BMI 31.31 kg/m  BP Readings from Last 3 Encounters:  08/28/22 119/83  10/26/21 119/79  07/19/21 114/77   Wt Readings from Last 3 Encounters:  08/28/22 205 lb 14.4 oz (93.4 kg)  10/26/21 196 lb 14.4 oz (89.3 kg)  07/19/21 188 lb 9.6 oz (85.5 kg)      Physical Exam HENT:     Head: Normocephalic.  Cardiovascular:     Rate and Rhythm: Normal rate and regular rhythm.     Pulses: Normal pulses.     Heart sounds: Normal heart sounds.  Pulmonary:     Effort: Pulmonary effort is normal.     Breath sounds: Normal breath sounds.  Skin:    Findings: Rash (scattered round rash to jaw, right cheek, non erythematous.) present.  Neurological:     General: No focal deficit present.     Mental Status: She is alert and oriented to person, place,  and time. Mental status is at baseline.  Psychiatric:        Mood and Affect: Mood normal.        Behavior: Behavior normal.        Thought Content: Thought content normal.        Judgment: Judgment normal.      No results found for any visits on 08/28/22.  Last CBC Lab Results  Component Value Date   WBC 4.1 06/21/2021   HGB 10.5 (L) 06/21/2021   HCT 31.2 (L) 06/21/2021   MCV 89 06/21/2021   MCH 29.9 06/21/2021   RDW 12.2 06/21/2021   PLT 365 35/78/9784   Last metabolic panel Lab Results  Component Value Date   GLUCOSE 89 06/21/2021   NA 139 06/21/2021   K 3.9 06/21/2021   CL 105 06/21/2021   CO2 20 06/21/2021   BUN 10 06/21/2021   CREATININE 0.78 06/21/2021   EGFR 104 06/21/2021   CALCIUM 9.2 06/21/2021   PHOS 3.7 06/21/2021   PROT 6.9 06/21/2021   ALBUMIN 4.3 06/21/2021   LABGLOB 2.6 06/21/2021   AGRATIO 1.7 06/21/2021   BILITOT <0.2 06/21/2021   ALKPHOS 46 06/21/2021   AST 16 06/21/2021   ALT 8 06/21/2021   ANIONGAP 11 11/27/2020   Last  lipids Lab Results  Component Value Date   CHOL 184 06/21/2021   HDL 65 06/21/2021   LDLCALC 108 (H) 06/21/2021   TRIG 57 06/21/2021   CHOLHDL 2.8 06/21/2021   Last hemoglobin A1c Lab Results  Component Value Date   HGBA1C 5.2 06/21/2021      The ASCVD Risk score (Arnett DK, et al., 2019) failed to calculate for the following reasons:   The 2019 ASCVD risk score is only valid for ages 52 to 58    Assessment & Plan:   1. Dry skin dermatitis - Unknown etiology of rash which is chronic. She will continue on Triamcinolone and was advised to follow up with Dermatology. - triamcinolone ointment (KENALOG) 0.1 %; Apply 1 application topically to the affected area(s) once or twice daily for 1 to 2 weeks.  Dispense: 30 g; Refill: 1  2. Health maintenance examination - Routine labs will be checked. - CBC with Differential/Platelet; Future - Comp Met (CMET); Future - HgB A1c; Future - Lipid panel; Future   Return  in about 3 months (around 11/27/2022), or if symptoms worsen or fail to improve.    Cynthia Hakimian Jerold Coombe, NP

## 2022-08-30 ENCOUNTER — Other Ambulatory Visit: Payer: Self-pay

## 2022-10-03 ENCOUNTER — Ambulatory Visit (LOCAL_COMMUNITY_HEALTH_CENTER): Payer: Self-pay | Admitting: Nurse Practitioner

## 2022-10-03 VITALS — BP 117/78 | HR 73 | Ht 67.0 in | Wt 206.0 lb

## 2022-10-03 DIAGNOSIS — Z01419 Encounter for gynecological examination (general) (routine) without abnormal findings: Secondary | ICD-10-CM

## 2022-10-03 DIAGNOSIS — Z3009 Encounter for other general counseling and advice on contraception: Secondary | ICD-10-CM

## 2022-10-03 NOTE — Progress Notes (Signed)
Pt appointment for Pap. Pt seen by FNP White. Declined education regarding birth control. Family planning packet given and contents reviewed.

## 2022-10-03 NOTE — Progress Notes (Signed)
[facility] Main Number: [phone]    Family Planning Visit- Initial Visit  Subjective:  Cynthia Payne is a 32 y.o.  G2P1010   being seen today for an initial annual visit and to discuss reproductive life planning.  The patient is currently using No Method - Other Reason for pregnancy prevention. Patient reports   does not want a pregnancy in the next year.     report they are looking for a method that provides Other not interested in a method   Patient has the following medical conditions has Dry skin dermatitis; Morbid obesity ([place]) 196 lbs; Smoker; and Alcohol abuse in remission on their problem list.  Chief Complaint  Patient presents with   Contraception    Pap    Patient reports to clinic today for a physical and PAP.    Body mass index is 32.26 kg/m. - Patient is eligible for diabetes screening based on BMI> 25 and age >41?  not applicable YS0Y ordered? not applicable  Patient reports 1  partner/s in last year. Desires STI screening?  No - refused  Has patient been screened once for HCV in the past?  Yes    Does the patient have current drug use (including MJ), have a partner with drug use, and/or has been incarcerated since last result? No  If yes-- Screen for HCV through [facility]   Does the patient meet criteria for HBV testing? Yes  Criteria:  -Household, sexual or needle sharing contact with HBV -HIV positive -Those with known Hep C   Health Maintenance Due  Topic Date Due   COVID-19 Vaccine (1) Never done   PAP SMEAR-Modifier  Never done   INFLUENZA VACCINE  Never done    Review of Systems  Constitutional:  Negative for chills, fever, malaise/fatigue and weight loss.  HENT:  Negative for congestion, hearing loss and sore throat.   Eyes:  Negative for blurred vision, double vision and photophobia.  Respiratory:  Negative for shortness of breath.    Cardiovascular:  Negative for chest pain.  Gastrointestinal:  Negative for abdominal pain, blood in stool, constipation, diarrhea, heartburn, nausea and vomiting.  Genitourinary:  Negative for dysuria and frequency.  Musculoskeletal:  Negative for back pain, joint pain and neck pain.  Skin:  Negative for itching and rash.  Neurological:  Negative for dizziness, weakness and headaches.  Endo/Heme/Allergies:  Does not bruise/bleed easily.  Psychiatric/Behavioral:  Negative for depression, substance abuse and suicidal ideas.     The following portions of the patient's history were reviewed and updated as appropriate: allergies, current medications, past family history, past medical history, past social history, past surgical history and problem list. Problem list updated.   See flowsheet for other program required questions.  Objective:   Vitals:   10/03/22 1415  BP: 117/78  Pulse: 73  Weight: 206 lb (93.4 kg)  Height: 5\' 7"  (1.702 m)    Physical Exam Constitutional:      Appearance: Normal appearance.  HENT:     Head: Normocephalic. No abrasion, masses or laceration. Hair is normal.     Jaw: No tenderness or swelling.     Right Ear: External ear normal.     Left Ear: External ear normal.     Nose: Nose normal.     Mouth/Throat:     Lips: Pink. No lesions.     Mouth: Mucous membranes are moist. No lacerations or oral lesions.  Dentition: No dental caries.     Tongue: No lesions.     Palate: No mass and lesions.     Pharynx: No pharyngeal swelling, oropharyngeal exudate, posterior oropharyngeal erythema or uvula swelling.     Tonsils: No tonsillar exudate or tonsillar abscesses.  Eyes:     Pupils: Pupils are equal, round, and reactive to light.  Neck:     Thyroid: No thyroid mass, thyromegaly or thyroid tenderness.  Cardiovascular:     Rate and Rhythm: Normal rate and regular rhythm.  Pulmonary:     Effort: Pulmonary effort is normal.     Breath sounds: Normal  breath sounds.  Chest:  Breasts:    Right: Normal. No swelling, mass, nipple discharge, skin change or tenderness.     Left: Normal. No swelling, mass, nipple discharge, skin change or tenderness.  Abdominal:     General: Abdomen is flat. Bowel sounds are normal.     Palpations: Abdomen is soft.     Tenderness: There is no abdominal tenderness. There is no rebound.  Genitourinary:    Pubic Area: No rash or pubic lice.      Labia:        Right: No rash, tenderness or lesion.        Left: No rash, tenderness or lesion.      Vagina: Normal. No vaginal discharge, erythema, tenderness or lesions.     Cervix: No cervical motion tenderness, discharge, lesion or erythema.     Uterus: Normal.      Adnexa:        Right: No tenderness.         Left: No tenderness.       Rectum: Normal.     Comments: Amount Discharge: small  Odor: No pH: less than 4.5 Adheres to vaginal wall: No Color: Cynthia Payne   nabothian cyst noted at 11 AM  Musculoskeletal:     Cervical back: Full passive range of motion without pain and normal range of motion.  Lymphadenopathy:     Cervical: No cervical adenopathy.     Right cervical: No superficial, deep or posterior cervical adenopathy.    Left cervical: No superficial, deep or posterior cervical adenopathy.     Upper Body:     Right upper body: No supraclavicular, axillary or epitrochlear adenopathy.     Left upper body: No supraclavicular, axillary or epitrochlear adenopathy.     Lower Body: No right inguinal adenopathy. No left inguinal adenopathy.  Skin:    General: Skin is warm and dry.     Findings: No erythema, laceration, lesion or rash.  Neurological:     Mental Status: She is alert and oriented to person, place, and time.  Psychiatric:        Attention and Perception: Attention normal.        Mood and Affect: Mood normal.        Speech: Speech normal.        Behavior: Behavior normal. Behavior is cooperative.       Assessment and Plan:  Cynthia Payne is a 32 y.o. female presenting to the [facility] Department for an initial annual wellness/contraceptive visit  Contraception counseling: Reviewed options based on patient desire and reproductive life plan. Patient is interested in No Method - Other Reason.   Risks, benefits, and typical effectiveness rates were reviewed.  Questions were answered.  Written information was also given to the patient to review.    The patient will follow up in  1 years for surveillance.  The patient was told to call with any further questions, or with any concerns about this method of contraception.  Emphasized use of condoms 100% of the time for STI prevention.  Need for ECP was assessed.  ECP not offered due to last sexual encounter.   1. Family planning counseling -32 year old female in clinic today for a physical and PAP.  -ROS reviewed,  no complaints noted.  -Patient declined all STD screenings today. -Patient not interested in birth control today.   2. Well woman exam with routine gynecological exam -Normal well woman exam. -CBE today, next due 09/2025 -PAP performed today.   - IGP, Aptima HPV   Total time spent: 30 minutes    Return in about 1 year (around 10/04/2023) for Annual well-woman exam.  Future Appointments  Date Time Provider [place]  11/27/2022  2:00 PM [place], Chioma E, NP [facility] None    Gregary Cromer, FNP

## 2022-10-08 LAB — IGP, APTIMA HPV
HPV Aptima: NEGATIVE
PAP Smear Comment: 0

## 2022-10-11 ENCOUNTER — Other Ambulatory Visit: Payer: Self-pay

## 2022-10-15 ENCOUNTER — Other Ambulatory Visit: Payer: Self-pay

## 2022-11-27 ENCOUNTER — Ambulatory Visit: Payer: Self-pay | Admitting: Gerontology

## 2022-12-20 ENCOUNTER — Ambulatory Visit: Payer: Self-pay | Admitting: Dermatology

## 2023-03-22 ENCOUNTER — Other Ambulatory Visit: Payer: Self-pay

## 2023-04-17 ENCOUNTER — Ambulatory Visit (INDEPENDENT_AMBULATORY_CARE_PROVIDER_SITE_OTHER): Payer: Self-pay | Admitting: Dermatology

## 2023-04-17 ENCOUNTER — Other Ambulatory Visit: Payer: Self-pay

## 2023-04-17 VITALS — BP 117/84

## 2023-04-17 DIAGNOSIS — Z79899 Other long term (current) drug therapy: Secondary | ICD-10-CM

## 2023-04-17 DIAGNOSIS — Z7189 Other specified counseling: Secondary | ICD-10-CM

## 2023-04-17 DIAGNOSIS — L2089 Other atopic dermatitis: Secondary | ICD-10-CM

## 2023-04-17 DIAGNOSIS — L700 Acne vulgaris: Secondary | ICD-10-CM

## 2023-04-17 DIAGNOSIS — L821 Other seborrheic keratosis: Secondary | ICD-10-CM

## 2023-04-17 DIAGNOSIS — L709 Acne, unspecified: Secondary | ICD-10-CM

## 2023-04-17 DIAGNOSIS — L209 Atopic dermatitis, unspecified: Secondary | ICD-10-CM

## 2023-04-17 MED ORDER — EUCRISA 2 % EX OINT
1.0000 | TOPICAL_OINTMENT | CUTANEOUS | 4 refills | Status: DC
  Filled 2023-04-17: qty 60, fill #0

## 2023-04-17 MED ORDER — AZELAIC ACID 15 % EX GEL
1.0000 | CUTANEOUS | 6 refills | Status: DC
  Filled 2023-04-17: qty 50, fill #0

## 2023-04-17 NOTE — Progress Notes (Signed)
   New Patient Visit   Subjective  Cynthia Payne is a 32 y.o. female who presents for the following: hx of dryness and scaling perioral, tried TMC 0.1% oint made worse, new moles face, dry itchy area elbows, aquaphor ointment, no fhx of psoriasis  New patient referral from [facility].  The following portions of the chart were reviewed this encounter and updated as appropriate: medications, allergies, medical history  Review of Systems:  No other skin or systemic complaints except as noted in HPI or Assessment and Plan.  Objective  Well appearing patient in no apparent distress; mood and affect are within normal limits.   A focused examination was performed of the following areas: Face, elbows  Relevant exam findings are noted in the Assessment and Plan.       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  It can be a rash and/or red bumps.  It occasionally occurs around the eyes.  It may be itchy and may burn.  The exact cause is unknown.  Some types of makeup, moisturizers, dental products, and prescription creams may be partially responsible for the eruption.  Topical steroids such as cortisone creams can temporarily make the rash better but with discontinuation the rash tends to recur and worsen, so they should be avoided. Topical antibiotics, elidel cream, protopic ointment, and oral antibiotics may be prescribed to treat this condition.  Although perioral dermatitis is not an infection, some antibiotics have anti-inflammatory properties that help it greatly.   Treatment Plan: Start Azelaic Acid qd/bid to face for acne   ATOPIC DERMATITIS (some features of Psoriasis) Elbows BSA 1% Exam: hyperpigmented scaly lichenified patch R elbow  Atopic dermatitis (eczema) is a chronic, relapsing,  pruritic condition that can significantly affect quality of life. It is often associated with allergic rhinitis and/or asthma and can require treatment with topical medications, phototherapy, or in severe cases biologic injectable medication (Dupixent; Adbry) or Oral JAK inhibitors.   Treatment Plan: Eucrisa ointment qd/bid to aa elbows until clear, then prn flares   SEBORRHEIC KERATOSIS - Stuck-on, waxy, tan-brown papules and/or plaques  - Benign-appearing - Discussed benign etiology and prognosis. - Observe - Call for any changes - face, discussed ED   Return in about 4 months (around 08/18/2023) for Acne f/u, Atopic Derm, SK.  I, Ardis Rowan, RMA, am acting as scribe for Armida Sans, MD .   Documentation: I have reviewed the above documentation for accuracy and completeness, and I agree with the above.  Armida Sans, MD

## 2023-04-17 NOTE — Patient Instructions (Signed)

## 2023-04-21 ENCOUNTER — Encounter: Payer: Self-pay | Admitting: Dermatology

## 2023-04-24 ENCOUNTER — Other Ambulatory Visit: Payer: Self-pay

## 2023-04-25 ENCOUNTER — Other Ambulatory Visit: Payer: Self-pay

## 2023-04-26 ENCOUNTER — Other Ambulatory Visit: Payer: Self-pay

## 2023-05-30 ENCOUNTER — Ambulatory Visit: Payer: Self-pay | Admitting: Gerontology

## 2023-05-30 ENCOUNTER — Encounter: Payer: Self-pay | Admitting: Gerontology

## 2023-05-30 VITALS — BP 112/68 | HR 58 | Ht 68.0 in | Wt 201.0 lb

## 2023-05-30 DIAGNOSIS — F17200 Nicotine dependence, unspecified, uncomplicated: Secondary | ICD-10-CM

## 2023-05-30 DIAGNOSIS — Z09 Encounter for follow-up examination after completed treatment for conditions other than malignant neoplasm: Secondary | ICD-10-CM | POA: Insufficient documentation

## 2023-05-30 NOTE — Patient Instructions (Signed)
Smoking Tobacco Information, Adult Smoking tobacco can be harmful to your health. Tobacco contains a toxic colorless chemical called nicotine. Nicotine causes changes in your brain that make you want more and more. This is called addiction. This can make it hard to stop smoking once you start. Tobacco also has other toxic chemicals that can hurt your body and raise your risk of many cancers. Menthol or "lite" tobacco or cigarette brands are not safer than regular brands. How can smoking tobacco affect me? Smoking tobacco puts you at risk for: Cancer. Smoking is most commonly associated with lung cancer, but can also lead to cancer in other parts of the body. Chronic obstructive pulmonary disease (COPD). This is a long-term lung condition that makes it hard to breathe. It also gets worse over time. High blood pressure (hypertension), heart disease, stroke, heart attack, and lung infections, such as pneumonia. Cataracts. This is when the lenses in the eyes become clouded. Digestive problems. This may include peptic ulcers, heartburn, and gastroesophageal reflux disease (GERD). Oral health problems, such as gum disease, mouth sores, and tooth loss. Loss of taste and smell. Smoking also affects how you look and smell. Smoking may cause: Wrinkles. Yellow or stained teeth, fingers, and fingernails. Bad breath. Bad-smelling clothes and hair. Smoking tobacco can also affect your social life, because: It may be challenging to find places to smoke when away from home. Many workplaces, restaurants, hotels, and public places are tobacco-free. Smoking is expensive. This is due to the cost of tobacco and the long-term costs of treating health problems from smoking. Secondhand smoke may affect those around you. Secondhand smoke can cause lung cancer, breathing problems, and heart disease. Children of smokers have a higher risk for: Sudden infant death syndrome (SIDS). Ear infections. Lung infections. What  actions can I take to prevent health problems? Quit smoking  Do not start smoking. Quit if you already smoke. Do not replace cigarette smoking with vaping devices, such as e-cigarettes. Make a plan to quit smoking and commit to it. Look for programs to help you, and ask your health care provider for recommendations and ideas. Set a date and write down all the reasons you want to quit. Let your friends and family know you are quitting so they can help and support you. Consider finding friends who also want to quit. It can be easier to quit with someone else, so that you can support each other. Talk with your health care provider about using nicotine replacement medicines to help you quit. These include gum, lozenges, patches, sprays, or pills. If you try to quit but return to smoking, stay positive. It is common to slip up when you first quit, so take it one day at a time. Be prepared for cravings. When you feel the urge to smoke, chew gum or suck on hard candy. Lifestyle Stay busy. Take care of your body. Get plenty of exercise, eat a healthy diet, and drink plenty of water. Find ways to manage your stress, such as meditation, yoga, exercise, or time spent with friends and family. Ask your health care provider about having regular tests (screenings) to check for cancer. This may include blood tests, imaging tests, and other tests. Where to find support To get support to quit smoking, consider: Asking your health care provider for more information and resources. Joining a support group for people who want to quit smoking in your local community. There are many effective programs that may help you to quit. Calling the smokefree.gov counselor   helpline at 1-800-QUIT-NOW (1-800-784-8669). Where to find more information You may find more information about quitting smoking from: Centers for Disease Control and Prevention: cdc.gov/tobacco Smokefree.gov: smokefree.gov American Lung Association:  freedomfromsmoking.org Contact a health care provider if: You have problems breathing. Your lips, nose, or fingers turn blue. You have chest pain. You are coughing up blood. You feel like you will faint. You have other health changes that cause you to worry. Summary Smoking tobacco can negatively affect your health, the health of those around you, your finances, and your social life. Do not start smoking. Quit if you already smoke. If you need help quitting, ask your health care provider. Consider joining a support group for people in your local community who want to quit smoking. There are many effective programs that may help you to quit. This information is not intended to replace advice given to you by your health care provider. Make sure you discuss any questions you have with your health care provider. Document Revised: 09/05/2021 Document Reviewed: 09/05/2021 Elsevier Patient Education  2024 Elsevier Inc.  

## 2023-05-30 NOTE — Progress Notes (Signed)
New Patient Office Visit  Subjective    Patient ID: Cynthia Payne, female    DOB: 1991-03-28  Age: 32 y.o. MRN: [id number]  CC: No chief complaint on file.   HPI Cynthia Payne  is a 32 y/o female who has history of substance use disorder,  and history of Atopic dermatitis who presents for follow up visit  and lab draw. She was seen by a dermatologist, Dr Armida Sans  on 04/17/23 for Atopic dermatitis who prescribed Eucrisa ointment qd/bid to be applied to her elbows until clear, then prn flares. She said that she used the cream  and it  cleared the rashes and she does not have any concerns regarding the rashes today.  She said she smokes 1 pack of cigarette in 3 days and  has no plan to quite. She is overall doing well and offered no further complaint.     Outpatient Encounter Medications as of 05/30/2023  Medication Sig   Azelaic Acid (FINACEA) 15 % gel Apply 1 Application topically as directed. After skin is thoroughly washed and patted dry, gently but thoroughly massage a thin film of azelaic acid cream into the affected area twice daily, in the morning and evening.   Crisaborole (EUCRISA) 2 % OINT Apply 1 Application topically as directed. qd to bid to aa elbows for eczema until clear, then prn flares   folic acid (FOLVITE) 1 MG tablet Take 1 tablet (1 mg total) by mouth once daily.   triamcinolone ointment (KENALOG) 0.1 % Apply 1 application topically to the affected area(s) once or twice daily for 1 to 2 weeks.   vitamin B-12 (CYANOCOBALAMIN) 1000 MCG tablet Take 1 tablet (1,000 mcg total) by mouth once daily.   No facility-administered encounter medications on file as of 05/30/2023.    Past Medical History:  Diagnosis Date   Substance abuse [facility])     Past Surgical History:  Procedure Laterality Date   NO PAST SURGERIES      Family History  Problem Relation Age of Onset   Healthy Mother    Congestive Heart Failure Father    Alcoholism Father    Drug  abuse Brother        heroin overdose    Social History   Socioeconomic History   Marital status: Single    Spouse name: Not on file   Number of children: Not on file   Years of education: Not on file   Highest education level: Not on file  Occupational History   Not on file  Tobacco Use   Smoking status: Every Day    Current packs/day: 0.25    Average packs/day: 0.3 packs/day for 15.0 years (3.8 ttl pk-yrs)    Types: Cigarettes   Smokeless tobacco: Never   Tobacco comments:    Patient is a resident at [facility] receiving treatment for alcoholism  Vaping Use   Vaping status: Never Used  Substance and Sexual Activity   Alcohol use: Not Currently    Comment: couple pints of liquor daily, Sober since 03/13/21   Drug use: Not Currently    Types: Marijuana    Comment: last use age 57   Sexual activity: Yes    Partners: Male    Birth control/protection: None, Abstinence  Other Topics Concern   Not on file  Social History Narrative   ** Merged History Encounter **       Social Determinants of Health   Financial Resource Strain: Low Risk  (05/30/2023)  Overall Financial Resource Strain (CARDIA)    Difficulty of Paying Living Expenses: Not hard at all  Food Insecurity: No Food Insecurity (05/30/2023)   Hunger Vital Sign    Worried About Running Out of Food in the Last Year: Never true    Ran Out of Food in the Last Year: Never true  Transportation Needs: No Transportation Needs (05/30/2023)   PRAPARE - Administrator, Civil Service (Medical): No    Lack of Transportation (Non-Medical): No  Physical Activity: Insufficiently Active (05/30/2023)   Exercise Vital Sign    Days of Exercise per Week: 3 days    Minutes of Exercise per Session: 30 min  Stress: No Stress Concern Present (05/30/2023)   [employer] of Occupational Health - Occupational Stress Questionnaire    Feeling of Stress : Not at all  Social Connections: Moderately Integrated (05/30/2023)   Social  Connection and Isolation Panel [NHANES]    Frequency of Communication with Friends and Family: More than three times a week    Frequency of Social Gatherings with Friends and Family: Once a week    Attends Religious Services: More than 4 times per year    Active Member of [employer] or Organizations: Yes    Attends Banker Meetings: More than 4 times per year    Marital Status: Never married  Intimate Partner Violence: Not At Risk (05/30/2023)   Humiliation, Afraid, Rape, and Kick questionnaire    Fear of Current or Ex-Partner: No    Emotionally Abused: No    Physically Abused: No    Sexually Abused: No    Review of Systems  Constitutional: Negative.   HENT: Negative.    Eyes: Negative.   Respiratory: Negative.    Cardiovascular: Negative.   Gastrointestinal: Negative.   Genitourinary: Negative.   Musculoskeletal: Negative.   Skin: Negative.   Neurological: Negative.   Psychiatric/Behavioral: Negative.          Objective    BP 112/68   Pulse (!) 58   Ht 5\' 8"  (1.727 m)   Wt 201 lb (91.2 kg)   SpO2 99%   BMI 30.56 kg/m   Physical Exam HENT:     Head: Normocephalic.     Mouth/Throat:     Mouth: Mucous membranes are moist.  Eyes:     Pupils: Pupils are equal, round, and reactive to light.  Cardiovascular:     Rate and Rhythm: Normal rate and regular rhythm.     Pulses: Normal pulses.     Heart sounds: Normal heart sounds.  Pulmonary:     Effort: Pulmonary effort is normal.     Breath sounds: Normal breath sounds.  Musculoskeletal:     Cervical back: Normal range of motion.  Skin:    General: Skin is warm.  Neurological:     General: No focal deficit present.     Mental Status: She is alert and oriented to person, place, and time.  Psychiatric:        Mood and Affect: Mood normal.         Assessment & Plan:  1. Smoker She was educated on the effects of smoking her on health and blood pressure. She was encouraged on smoking cessation and  provided with [place] Quit line information.   2. Follow-up exam Routine labs will be checked.  - CBC w/Diff; Future - Comp Met (CMET); Future - Lipid Profile; Future - HgB A1c; Future     F/u in the clinic in  6 months around 11/27/23 or if the condition gets worse or fails to improve.  Mickie Hillier, FNP

## 2023-06-05 ENCOUNTER — Other Ambulatory Visit: Payer: Self-pay

## 2023-06-05 DIAGNOSIS — F17200 Nicotine dependence, unspecified, uncomplicated: Secondary | ICD-10-CM

## 2023-06-06 LAB — COMPREHENSIVE METABOLIC PANEL
ALT: 13 IU/L (ref 0–32)
AST: 23 IU/L (ref 0–40)
Albumin: 4.4 g/dL (ref 3.9–4.9)
Alkaline Phosphatase: 46 IU/L (ref 44–121)
BUN/Creatinine Ratio: 10 (ref 9–23)
BUN: 9 mg/dL (ref 6–20)
Bilirubin Total: 0.2 mg/dL (ref 0.0–1.2)
CO2: 16 mmol/L — ABNORMAL LOW (ref 20–29)
Calcium: 9.4 mg/dL (ref 8.7–10.2)
Chloride: 106 mmol/L (ref 96–106)
Creatinine, Ser: 0.88 mg/dL (ref 0.57–1.00)
Globulin, Total: 2.9 g/dL (ref 1.5–4.5)
Glucose: 89 mg/dL (ref 70–99)
Potassium: 5 mmol/L (ref 3.5–5.2)
Sodium: 138 mmol/L (ref 134–144)
Total Protein: 7.3 g/dL (ref 6.0–8.5)
eGFR: 89 mL/min/{1.73_m2} (ref >59)

## 2023-06-06 LAB — CBC WITH DIFFERENTIAL/PLATELET
Basophils Absolute: 0.1 10*3/uL (ref 0.0–0.2)
Basos: 1 %
EOS (ABSOLUTE): 0.2 10*3/uL (ref 0.0–0.4)
Eos: 3 %
Hematocrit: 34.9 % (ref 34.0–46.6)
Hemoglobin: 10.4 g/dL — ABNORMAL LOW (ref 11.1–15.9)
Immature Grans (Abs): 0 10*3/uL (ref 0.0–0.1)
Immature Granulocytes: 0 %
Lymphocytes Absolute: 2 10*3/uL (ref 0.7–3.1)
Lymphs: 38 %
MCH: 24.1 pg — ABNORMAL LOW (ref 26.6–33.0)
MCHC: 29.8 g/dL — ABNORMAL LOW (ref 31.5–35.7)
MCV: 81 fL (ref 79–97)
Monocytes Absolute: 0.6 10*3/uL (ref 0.1–0.9)
Monocytes: 12 %
Neutrophils Absolute: 2.4 10*3/uL (ref 1.4–7.0)
Neutrophils: 46 %
Platelets: 389 10*3/uL (ref 150–450)
RBC: 4.31 x10E6/uL (ref 3.77–5.28)
RDW: 16 % — ABNORMAL HIGH (ref 11.7–15.4)
WBC: 5.3 10*3/uL (ref 3.4–10.8)

## 2023-06-06 LAB — HEMOGLOBIN A1C
Est. average glucose Bld gHb Est-mCnc: 114 mg/dL
Hgb A1c MFr Bld: 5.6 % (ref 4.8–5.6)

## 2023-06-06 LAB — LIPID PANEL
Chol/HDL Ratio: 2.8 ratio (ref 0.0–4.4)
Cholesterol, Total: 176 mg/dL (ref 100–199)
HDL: 63 mg/dL (ref >39)
LDL Chol Calc (NIH): 104 mg/dL — ABNORMAL HIGH (ref 0–99)
Triglycerides: 45 mg/dL (ref 0–149)
VLDL Cholesterol Cal: 9 mg/dL (ref 5–40)

## 2023-08-14 ENCOUNTER — Ambulatory Visit: Payer: Self-pay | Admitting: Dermatology

## 2023-11-27 ENCOUNTER — Ambulatory Visit: Payer: Self-pay | Admitting: Gerontology

## 2023-11-28 ENCOUNTER — Ambulatory Visit: Payer: Self-pay | Admitting: Gerontology

## 2023-12-17 ENCOUNTER — Ambulatory Visit: Payer: Self-pay | Admitting: Gerontology

## 2024-02-18 ENCOUNTER — Telehealth: Payer: Self-pay | Admitting: Gerontology

## 2024-02-18 NOTE — Telephone Encounter (Signed)
 Pt was contacted in regards to scheduling her next appointment. Pt was at work and said they would call back at a later date to schedule.

## 2024-03-05 ENCOUNTER — Ambulatory Visit: Payer: Self-pay | Admitting: Gerontology

## 2024-03-19 ENCOUNTER — Other Ambulatory Visit: Payer: Self-pay

## 2024-03-24 ENCOUNTER — Telehealth: Payer: Self-pay | Admitting: Gerontology

## 2024-03-24 NOTE — Telephone Encounter (Signed)
 Called to confirm appointment this Thursday. Call was answered and appointment confirmed.

## 2024-03-26 ENCOUNTER — Other Ambulatory Visit: Payer: Self-pay

## 2024-03-26 ENCOUNTER — Ambulatory Visit: Payer: Self-pay | Admitting: Gerontology

## 2024-03-26 ENCOUNTER — Encounter: Payer: Self-pay | Admitting: Gerontology

## 2024-03-26 VITALS — BP 118/74 | HR 90 | Wt 193.0 lb

## 2024-03-26 DIAGNOSIS — Z0000 Encounter for general adult medical examination without abnormal findings: Secondary | ICD-10-CM

## 2024-03-26 DIAGNOSIS — L853 Xerosis cutis: Secondary | ICD-10-CM

## 2024-03-26 MED ORDER — TRIAMCINOLONE ACETONIDE 0.1 % EX CREA
1.0000 | TOPICAL_CREAM | Freq: Two times a day (BID) | CUTANEOUS | 0 refills | Status: DC
  Filled 2024-03-26: qty 30, 15d supply, fill #0

## 2024-03-26 NOTE — Progress Notes (Signed)
 Established Patient Office Visit  Subjective   Patient ID: Cynthia Payne, female    DOB: 10-04-1990  Age: 33 y.o. MRN: [id number]  No chief complaint on file.   HPI  Cynthia Payne  is a 33 y/o female who has history of substance use disorder,  and history of Atopic dermatitis who presents for follow up visit . She no showed to many of her appointments. Currently, she reports non pruritic,  dry flaky lesions to her elbows. Last visit with Dermatologist Dr Hester LAPINE on 04/16/24,Eucrisa  ointment qd/bid to elbows until clear, then prn flares , but she states that she never picked up medication because it was very expensive. Overall, she states that she's doing well and offers no further complaint.  Review of Systems  Constitutional: Negative.   HENT: Negative.    Eyes: Negative.   Respiratory: Negative.    Cardiovascular: Negative.   Skin:        Eczema flare to elbows  Neurological: Negative.   Endo/Heme/Allergies: Negative.   Psychiatric/Behavioral: Negative.        Objective:     BP 118/74   Pulse 90   Wt 193 lb (87.5 kg)   LMP 02/25/2024 (Approximate)   SpO2 98%   BMI 29.35 kg/m  BP Readings from Last 3 Encounters:  03/26/24 118/74  05/30/23 112/68  04/17/23 117/84   Wt Readings from Last 3 Encounters:  03/26/24 193 lb (87.5 kg)  05/30/23 201 lb (91.2 kg)  10/03/22 206 lb (93.4 kg)      Physical Exam HENT:     Head: Normocephalic and atraumatic.     Mouth/Throat:     Mouth: Mucous membranes are moist.     Pharynx: Oropharynx is clear.  Eyes:     Extraocular Movements: Extraocular movements intact.     Pupils: Pupils are equal, round, and reactive to light.  Cardiovascular:     Rate and Rhythm: Normal rate and regular rhythm.     Pulses: Normal pulses.     Heart sounds: Normal heart sounds.  Pulmonary:     Effort: Pulmonary effort is normal.     Breath sounds: Normal breath sounds.  Skin:    General: Skin is warm and dry.      Capillary Refill: Capillary refill takes less than 2 seconds.     Findings: Lesion (dry flaky eczema to elbows) present.  Neurological:     General: No focal deficit present.     Mental Status: She is alert and oriented to person, place, and time.  Psychiatric:        Mood and Affect: Mood normal.        Behavior: Behavior normal.        Thought Content: Thought content normal.        Judgment: Judgment normal.      No results found for any visits on 03/26/24.  Last CBC Lab Results  Component Value Date   WBC 5.3 06/05/2023   HGB 10.4 (L) 06/05/2023   HCT 34.9 06/05/2023   MCV 81 06/05/2023   MCH 24.1 (L) 06/05/2023   RDW 16.0 (H) 06/05/2023   PLT 389 06/05/2023   Last metabolic panel Lab Results  Component Value Date   GLUCOSE 89 06/05/2023   NA 138 06/05/2023   K 5.0 06/05/2023   CL 106 06/05/2023   CO2 16 (L) 06/05/2023   BUN 9 06/05/2023   CREATININE 0.88 06/05/2023   EGFR 89 06/05/2023   CALCIUM 9.4 06/05/2023  PHOS 3.7 06/21/2021   PROT 7.3 06/05/2023   ALBUMIN 4.4 06/05/2023   LABGLOB 2.9 06/05/2023   AGRATIO 1.7 06/21/2021   BILITOT 0.2 06/05/2023   ALKPHOS 46 06/05/2023   AST 23 06/05/2023   ALT 13 06/05/2023   ANIONGAP 11 11/27/2020   Last lipids Lab Results  Component Value Date   CHOL 176 06/05/2023   HDL 63 06/05/2023   LDLCALC 104 (H) 06/05/2023   TRIG 45 06/05/2023   CHOLHDL 2.8 06/05/2023   Last hemoglobin A1c Lab Results  Component Value Date   HGBA1C 5.6 06/05/2023   Last thyroid functions Lab Results  Component Value Date   TSH 0.784 06/21/2021   Last vitamin D No results found for: 25OHVITD2, 25OHVITD3, VD25OH Last vitamin B12 and Folate No results found for: VITAMINB12, FOLATE    The ASCVD Risk score (Arnett DK, et al., 2019) failed to calculate for the following reasons:   The 2019 ASCVD risk score is only valid for ages 39 to 6    Assessment & Plan:   1. Dry skin dermatitis (Primary) - She was started  on Triamcinolone , educated on medication side effects and advised to notify clinic. - triamcinolone  cream (KENALOG ) 0.1 %; Apply 1 Application topically 2 (two) times daily.  Dispense: 30 g; Refill: 0  2. Health care maintenance - Routine labs will be checked. - Comp Met (CMET); Future - CBC w/Diff; Future - Lipid panel; Future - HgB A1c; Future - Iron Binding Cap (TIBC)([facility]); Future   Return in about 14 weeks (around 07/02/2024), or if symptoms worsen or fail to improve, for Lab 06/23/24, F/U 07/02/24 in clinic.    Oma Marzan E Rishabh Rinkenberger, NP

## 2024-06-23 ENCOUNTER — Other Ambulatory Visit: Payer: Self-pay

## 2024-07-02 ENCOUNTER — Ambulatory Visit: Payer: Self-pay | Admitting: Gerontology

## 2024-07-16 ENCOUNTER — Ambulatory Visit: Payer: Self-pay | Admitting: Gerontology

## 2024-07-21 ENCOUNTER — Other Ambulatory Visit: Payer: Self-pay

## 2024-07-21 ENCOUNTER — Encounter: Payer: Self-pay | Admitting: Gerontology

## 2024-07-21 ENCOUNTER — Ambulatory Visit: Payer: Self-pay | Admitting: Gerontology

## 2024-07-21 DIAGNOSIS — L853 Xerosis cutis: Secondary | ICD-10-CM

## 2024-07-21 MED ORDER — TRIAMCINOLONE ACETONIDE 0.1 % EX CREA
1.0000 | TOPICAL_CREAM | Freq: Two times a day (BID) | CUTANEOUS | 0 refills | Status: AC
  Filled 2024-07-21: qty 80, 30d supply, fill #0

## 2024-07-21 NOTE — Progress Notes (Signed)
 Established Patient Office Visit  Subjective   Patient ID: Cynthia Payne, female    DOB: [birth year]  Age: 33 y.o. MRN: [id number]   HPI Cynthia Payne  is a 33 y/o female who has history of substance use disorder,  and history of Atopic dermatitis who presents for follow up visit . She no showed to many of her appointments. Currently, she reports non pruritic,  dry flaky lesions to her elbows and hands. Last visit with Dermatologist Dr Hester LAPINE on 04/17/23, Eucrisa  ointment qd/bid to elbows until clear, then prn flares , but she states that she never picked up medication because it was very expensive. She reports applying triamcinolone  (stopped using this in July) and is now utilizing Aquaphor daily. At today's visit She reports her Itching is worse at night on bilateral hands and in between fingers and on back of neck. She states symptoms come and go in phases will be gone for months but notices increase in flares in the fall time and winter. Overall mood is good and reports no other concerns at this time. Denies getting labs at this visit. States she needs support person and will do labs at next visit.   Review of Systems  Constitutional: Negative.   HENT: Negative.    Eyes: Negative.   Respiratory: Negative.    Cardiovascular: Negative.   Gastrointestinal: Negative.   Genitourinary: Negative.   Musculoskeletal: Negative.   Skin:  Positive for rash.       Bilateral hands, back of neck.  Neurological: Negative.   Psychiatric/Behavioral: Negative.        Objective:     There were no vitals taken for this visit. BP Readings from Last 3 Encounters:  03/26/24 118/74  05/30/23 112/68  04/17/23 117/84   Wt Readings from Last 3 Encounters:  03/26/24 87.5 kg  05/30/23 91.2 kg  10/03/22 93.4 kg      Physical Exam HENT:     Head: Normocephalic and atraumatic.     Mouth/Throat:     Mouth: Mucous membranes are moist.  Eyes:     Extraocular Movements:  Extraocular movements intact.     Pupils: Pupils are equal, round, and reactive to light.  Cardiovascular:     Rate and Rhythm: Normal rate and regular rhythm.     Pulses: Normal pulses.     Heart sounds: Normal heart sounds.  Pulmonary:     Effort: Pulmonary effort is normal.     Breath sounds: Normal breath sounds.  Musculoskeletal:        General: Normal range of motion.  Skin:    General: Skin is warm and dry.     Capillary Refill: Capillary refill takes less than 2 seconds.     Findings: Lesion (dry flaky area to hands.) present. No rash.  Neurological:     General: No focal deficit present.     Mental Status: She is alert and oriented to person, place, and time.  Psychiatric:        Mood and Affect: Mood normal.        Behavior: Behavior normal.        Thought Content: Thought content normal.        Judgment: Judgment normal.      No results found for any visits on 07/21/24.  Last CBC Lab Results  Component Value Date   WBC 5.3 06/05/2023   HGB 10.4 (L) 06/05/2023   HCT 34.9 06/05/2023   MCV 81 06/05/2023   MCH 24.1 (  L) 06/05/2023   RDW 16.0 (H) 06/05/2023   PLT 389 06/05/2023   Last metabolic panel Lab Results  Component Value Date   GLUCOSE 89 06/05/2023   NA 138 06/05/2023   K 5.0 06/05/2023   CL 106 06/05/2023   CO2 16 (L) 06/05/2023   BUN 9 06/05/2023   CREATININE 0.88 06/05/2023   EGFR 89 06/05/2023   CALCIUM 9.4 06/05/2023   PHOS 3.7 06/21/2021   PROT 7.3 06/05/2023   ALBUMIN 4.4 06/05/2023   LABGLOB 2.9 06/05/2023   AGRATIO 1.7 06/21/2021   BILITOT 0.2 06/05/2023   ALKPHOS 46 06/05/2023   AST 23 06/05/2023   ALT 13 06/05/2023   ANIONGAP 11 11/27/2020   Last lipids Lab Results  Component Value Date   CHOL 176 06/05/2023   HDL 63 06/05/2023   LDLCALC 104 (H) 06/05/2023   TRIG 45 06/05/2023   CHOLHDL 2.8 06/05/2023   Last hemoglobin A1c Lab Results  Component Value Date   HGBA1C 5.6 06/05/2023   Last thyroid functions Lab  Results  Component Value Date   TSH 0.784 06/21/2021   Last vitamin D No results found for: 25OHVITD2, 25OHVITD3, VD25OH Last vitamin B12 and Folate No results found for: VITAMINB12, FOLATE    The ASCVD Risk score (Arnett DK, et al., 2019) failed to calculate for the following reasons:   The 2019 ASCVD risk score is only valid for ages 29 to 51    Assessment & Plan:  1. Dry skin dermatitis (Primary) Refilled: She declines Dermatology visit due to cost and agreed to continue applying Triamcinolone  cream. She was advised to notify clinic for worsening symptoms. - triamcinolone  cream (KENALOG ) 0.1 %; Apply 1 Application topically 2 (two) times daily.  Dispense: 80 g; Refill: 0  Continue applying Aquaphor as needed.   Follow up in clinic in 3 months for clinical visit- return sooner should concerns arise.    Alan Monte, FNP Student

## 2024-08-25 ENCOUNTER — Other Ambulatory Visit: Payer: Self-pay

## 2024-10-22 ENCOUNTER — Ambulatory Visit: Payer: Self-pay | Admitting: Gerontology
